# Patient Record
Sex: Female | Born: 1980 | Race: White | Hispanic: No | Marital: Single | Smoking: Never smoker
Health system: Southern US, Community
[De-identification: ages and names within clinical notes are randomized; demographics above are authoritative.]

## PROBLEM LIST (undated history)

## (undated) DIAGNOSIS — F419 Anxiety disorder, unspecified: Secondary | ICD-10-CM

## (undated) DIAGNOSIS — K760 Fatty (change of) liver, not elsewhere classified: Secondary | ICD-10-CM

## (undated) DIAGNOSIS — R238 Other skin changes: Secondary | ICD-10-CM

## (undated) DIAGNOSIS — H9190 Unspecified hearing loss, unspecified ear: Secondary | ICD-10-CM

## (undated) DIAGNOSIS — R112 Nausea with vomiting, unspecified: Secondary | ICD-10-CM

## (undated) DIAGNOSIS — E039 Hypothyroidism, unspecified: Secondary | ICD-10-CM

## (undated) DIAGNOSIS — Z9889 Other specified postprocedural states: Secondary | ICD-10-CM

## (undated) DIAGNOSIS — L853 Xerosis cutis: Secondary | ICD-10-CM

## (undated) DIAGNOSIS — K224 Dyskinesia of esophagus: Secondary | ICD-10-CM

## (undated) DIAGNOSIS — Q909 Down syndrome, unspecified: Secondary | ICD-10-CM

## (undated) DIAGNOSIS — K219 Gastro-esophageal reflux disease without esophagitis: Secondary | ICD-10-CM

## (undated) DIAGNOSIS — H6093 Unspecified otitis externa, bilateral: Secondary | ICD-10-CM

## (undated) DIAGNOSIS — C49A2 Gastrointestinal stromal tumor of stomach: Secondary | ICD-10-CM

## (undated) DIAGNOSIS — K8020 Calculus of gallbladder without cholecystitis without obstruction: Secondary | ICD-10-CM

## (undated) DIAGNOSIS — E785 Hyperlipidemia, unspecified: Secondary | ICD-10-CM

## (undated) HISTORY — DX: Calculus of gallbladder without cholecystitis without obstruction: K80.20

## (undated) HISTORY — DX: Unspecified otitis externa, bilateral: H60.93

## (undated) HISTORY — PX: NASAL ENDOSCOPY: SHX286

## (undated) HISTORY — PX: TONSILLECTOMY: SUR1361

## (undated) HISTORY — PX: WISDOM TOOTH EXTRACTION: SHX21

## (undated) HISTORY — DX: Xerosis cutis: L85.3

## (undated) HISTORY — DX: Down syndrome, unspecified: Q90.9

## (undated) HISTORY — DX: Gastro-esophageal reflux disease without esophagitis: K21.9

## (undated) HISTORY — DX: Dyskinesia of esophagus: K22.4

## (undated) HISTORY — DX: Fatty (change of) liver, not elsewhere classified: K76.0

## (undated) HISTORY — DX: Hyperlipidemia, unspecified: E78.5

## (undated) HISTORY — DX: Gastrointestinal stromal tumor of stomach: C49.A2

---

## 2002-10-03 ENCOUNTER — Ambulatory Visit (HOSPITAL_BASED_OUTPATIENT_CLINIC_OR_DEPARTMENT_OTHER): Admission: RE | Admit: 2002-10-03 | Discharge: 2002-10-03 | Payer: Self-pay | Admitting: General Surgery

## 2004-01-27 ENCOUNTER — Ambulatory Visit: Payer: Self-pay | Admitting: Endocrinology

## 2004-02-04 ENCOUNTER — Ambulatory Visit: Payer: Self-pay | Admitting: Endocrinology

## 2004-04-13 ENCOUNTER — Ambulatory Visit: Payer: Self-pay | Admitting: Endocrinology

## 2004-06-25 ENCOUNTER — Ambulatory Visit: Payer: Self-pay | Admitting: Endocrinology

## 2004-11-29 ENCOUNTER — Ambulatory Visit: Payer: Self-pay | Admitting: Endocrinology

## 2006-01-11 ENCOUNTER — Ambulatory Visit: Payer: Self-pay | Admitting: Endocrinology

## 2007-03-20 ENCOUNTER — Encounter: Payer: Self-pay | Admitting: Endocrinology

## 2007-03-20 ENCOUNTER — Ambulatory Visit: Payer: Self-pay | Admitting: Endocrinology

## 2007-03-20 DIAGNOSIS — E7800 Pure hypercholesterolemia, unspecified: Secondary | ICD-10-CM | POA: Insufficient documentation

## 2007-03-20 DIAGNOSIS — E039 Hypothyroidism, unspecified: Secondary | ICD-10-CM | POA: Insufficient documentation

## 2007-03-20 DIAGNOSIS — Q909 Down syndrome, unspecified: Secondary | ICD-10-CM | POA: Insufficient documentation

## 2007-03-20 DIAGNOSIS — R7309 Other abnormal glucose: Secondary | ICD-10-CM

## 2007-03-20 LAB — CONVERTED CEMR LAB
Hgb A1c MFr Bld: 4.9 % (ref 4.6–6.0)
TSH: 22.47 microintl units/mL — ABNORMAL HIGH (ref 0.35–5.50)
Triglycerides: 133 mg/dL (ref 0–149)
VLDL: 27 mg/dL (ref 0–40)

## 2007-05-29 ENCOUNTER — Ambulatory Visit: Payer: Self-pay | Admitting: Endocrinology

## 2007-05-29 LAB — CONVERTED CEMR LAB
Cholesterol: 141 mg/dL (ref 0–200)
HDL: 30.4 mg/dL — ABNORMAL LOW (ref >39.0)
Total CHOL/HDL Ratio: 4.6
Triglycerides: 108 mg/dL (ref 0–149)

## 2007-06-19 ENCOUNTER — Telehealth (INDEPENDENT_AMBULATORY_CARE_PROVIDER_SITE_OTHER): Payer: Self-pay | Admitting: *Deleted

## 2008-03-20 ENCOUNTER — Ambulatory Visit: Payer: Self-pay | Admitting: Endocrinology

## 2008-03-20 LAB — CONVERTED CEMR LAB
AST: 19 units/L (ref 0–37)
Albumin: 3.8 g/dL (ref 3.5–5.2)
Calcium, Total (PTH): 9.2 mg/dL (ref 8.4–10.5)
HDL: 36.2 mg/dL — ABNORMAL LOW (ref >39.0)
Hgb A1c MFr Bld: 5 % (ref 4.6–6.0)
LDL Cholesterol: 94 mg/dL (ref 0–99)
TSH: 0.26 microintl units/mL — ABNORMAL LOW (ref 0.35–5.50)
Total Bilirubin: 1.1 mg/dL (ref 0.3–1.2)
Total CHOL/HDL Ratio: 4.4
Triglycerides: 147 mg/dL (ref 0–149)
VLDL: 29 mg/dL (ref 0–40)

## 2009-04-16 ENCOUNTER — Ambulatory Visit: Payer: Self-pay | Admitting: Endocrinology

## 2009-04-16 LAB — CONVERTED CEMR LAB
ALT: 17 units/L (ref 0–35)
Alkaline Phosphatase: 72 units/L (ref 39–117)
Bilirubin, Direct: 0.1 mg/dL (ref 0.0–0.3)
Cholesterol: 132 mg/dL (ref 0–200)
Hgb A1c MFr Bld: 4.9 % (ref 4.6–6.5)
Total Bilirubin: 0.9 mg/dL (ref 0.3–1.2)
VLDL: 30.8 mg/dL (ref 0.0–40.0)

## 2009-04-21 ENCOUNTER — Ambulatory Visit: Payer: Self-pay | Admitting: Family Medicine

## 2009-04-21 ENCOUNTER — Encounter: Payer: Self-pay | Admitting: Endocrinology

## 2010-02-09 NOTE — Miscellaneous (Signed)
Summary: BONE DENSITY  Clinical Lists Changes  Orders: Added new Test order of T-Bone Densitometry (77080) - Signed Added new Test order of T-Lumbar Vertebral Assessment (77082) - Signed 

## 2010-02-09 NOTE — Assessment & Plan Note (Signed)
Summary: FU Sara Mcgee   Vital Signs:  Patient profile:   30 year old female Height:      60 inches (152.40 cm) Weight:      169 pounds (76.82 kg) BMI:     33.12 O2 Sat:      97 % on Room air Temp:     97.7 degrees F (36.50 degrees C) oral Pulse rate:   82 / minute BP sitting:   108 / 76  (left arm) Cuff size:   regular  Vitals Entered By: Josph Macho RMA (April 16, 2009 9:58 AM)  O2 Flow:  Room air CC: Follow-up visit/ pts caretaker states she is not taking the Synthroid 112 MCG/ CF Is Patient Diabetic? No   Primary Provider:  d moore  CC:  Follow-up visit/ pts caretaker states she is not taking the Synthroid 112 MCG/ CF.  History of Present Illness: the status of at least 3 ongoing medical problems is addressed today: hypothyroidism:  pt is here with sister-in-law (Sara Mcgee).  pt says she feels well on synthroid. dyslipidemia:  she takes lovastatin as rx'ed.  mother says pt's diet is better, since she has been going to "curves." down's syndrome:  she has regular menses, despite no estrogen rx.  no falls.    Current Medications (verified): 1)  Synthroid 88 Mcg  Tabs (Levothyroxine Sodium) .... Take 1 By Mouth Once Daily Need Appt For Addtional Refills 2)  Lovastatin 40 Mg  Tabs (Lovastatin) .... Take 2 By Mouth Qd 3)  Synthroid 112 Mcg  Tabs (Levothyroxine Sodium) .... Qd  Allergies (verified): No Known Drug Allergies  Past History:  Past Medical History: Last updated: 03/20/2007 Hyperthyroidism Hypothyroidism  Review of Systems  The patient denies dyspnea on exertion and chest pain.    Physical Exam  General:  obese.  down's syndrome appearance. Neck:  no masses, thyromegaly, or abnormal cervical nodes Lungs:  Clear to auscultation bilaterally. Normal respiratory effort.  Heart:  Regular rate and rhythm without murmurs or gallops noted. Normal S1,S2.   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 1 once daily  Other Orders: T-Bone Densitometry (19147) TLB-Lipid Panel (80061-LIPID) TLB-Hepatic/Liver Function Pnl (80076-HEPATIC) TLB-TSH (Thyroid Stimulating Hormone) (84443-TSH) TLB-A1C / Hgb A1C (Glycohemoglobin) (83036-A1C) Est. Patient Level IV (82956)  Patient Instructions: 1)  tests are being ordered for you today.  a few  days after the test(s), please call [phone] to hear your test results. 2)  pending the test results, please continue the same medications for now. 3)  check bone-density x ray. 4)  Please schedule a follow-up appointment in 1 year. 5)  (update: i left message on phone-tree:  please verify synthroid dosage currently is 88/day.  then reduce to 50/day.  go to lab in 4-6 weeks for tsh 244.9). Prescriptions: LEVOTHYROXINE SODIUM 50 MCG TABS (LEVOTHYROXINE SODIUM) 1 once daily  #30 x 2   Entered and Authorized by:   Minus Breeding MD   Signed by:   Minus Breeding MD on 04/16/2009   Method used:   Electronically to        [facility] [phone]* (retail)       [address] [place], [place]  [address]       Ph: [phone] or [phone]       Fax: [phone]   RxID:   [id number]   Appended Document: Orders Update    Clinical Lists Changes  Orders: Added new Service order of Prescription Created Electronically  [phone]) - Signed

## 2010-05-28 NOTE — Op Note (Signed)
   NAME:  LYNDIA, BURY                           ACCOUNT NO.:  [id number]   MEDICAL RECORD NO.:  [id number]                   PATIENT TYPE:  AMB   LOCATION:  [facility]                                  FACILITY:  [facility]   PHYSICIAN:  Griffith Citron Mohorn, D.D.S.          DATE OF BIRTH:  [birth year]   DATE OF PROCEDURE:  10/03/2002  DATE OF DISCHARGE:                                 OPERATIVE REPORT   PREOPERATIVE DIAGNOSIS:  1. Decayed nonrestorable tooth number 20.  2. Down syndrome.   POSTOPERATIVE DIAGNOSIS:  1. Decayed nonrestorable tooth number 20.  2. Down syndrome.   OPERATION PERFORMED:  Extraction of tooth number 20.   SURGEON:  Griffith Citron Mohorn, D.D.S.   ANESTHESIA:  General.   INDICATIONS FOR PROCEDURE:  The patient was evaluated in my office and found  to have a painful and nonrestorable tooth number 20 due to deep decay.  The  patient initially seemed sufficiently cooperative despite her condition;  however, on the day of surgery in the office she was unable to cooperate  sufficiently to allow for placement of an IV in order to perform deep  sedation to have tooth number 20 taken out in the office.  The procedure was  terminated and she was scheduled for the same procedure at [facility].   DESCRIPTION OF PROCEDURE:  Once the anesthesia team was able to establish IV  access, the patient was brought to the room and placed in supine position.  Once general anesthesia was induced via orotracheal intubation, the patient  was prepped and draped for a maxillofacial procedure of this type.  Initially, local anesthesia was injected consisting of 2% lidocaine with  1:100,000 epinephrine adjacent to tooth #20.  An incision was made adjacent  to tooth #20 and the gingival tissue was reflected.  The tooth was loosened  and extracted without complications.  The apex of the socket was curetted  and irrigated with normal saline.  A single 4.0 chromic suture was  placed  over the extraction site to facilitate hemostasis and healing.  The patient  was allowed to awaken and was extubated without complications.  She was  transferred to the PACU in stable and satisfactory condition.                                                Griffith Citron Mohorn, D.D.S.    Gardenia Phlegm  D:  10/03/2002  T:  10/03/2002  Job:  [id number]

## 2012-02-16 ENCOUNTER — Other Ambulatory Visit (HOSPITAL_COMMUNITY): Payer: Self-pay | Admitting: Otolaryngology

## 2012-02-16 ENCOUNTER — Ambulatory Visit (HOSPITAL_COMMUNITY)
Admission: RE | Admit: 2012-02-16 | Discharge: 2012-02-16 | Disposition: A | Discharge status: Home / Self Care | Payer: Medicare Other | Source: Ambulatory Visit | Attending: Otolaryngology | Admitting: Otolaryngology

## 2012-02-16 DIAGNOSIS — M532X1 Spinal instabilities, occipito-atlanto-axial region: Secondary | ICD-10-CM

## 2012-02-16 DIAGNOSIS — IMO0002 Reserved for concepts with insufficient information to code with codable children: Secondary | ICD-10-CM | POA: Insufficient documentation

## 2012-02-16 DIAGNOSIS — Z01818 Encounter for other preprocedural examination: Secondary | ICD-10-CM | POA: Insufficient documentation

## 2012-04-10 ENCOUNTER — Other Ambulatory Visit: Payer: Self-pay | Admitting: Otolaryngology

## 2012-11-05 ENCOUNTER — Ambulatory Visit (INDEPENDENT_AMBULATORY_CARE_PROVIDER_SITE_OTHER): Payer: Medicare Other | Admitting: Nurse Practitioner

## 2012-11-05 ENCOUNTER — Encounter: Payer: Self-pay | Admitting: Nurse Practitioner

## 2012-11-05 VITALS — BP 122/87 | HR 80 | Temp 98.0°F | Ht 60.0 in | Wt 175.0 lb

## 2012-11-05 DIAGNOSIS — Z0000 Encounter for general adult medical examination without abnormal findings: Secondary | ICD-10-CM

## 2012-11-05 DIAGNOSIS — E7800 Pure hypercholesterolemia, unspecified: Secondary | ICD-10-CM

## 2012-11-05 DIAGNOSIS — R1310 Dysphagia, unspecified: Secondary | ICD-10-CM

## 2012-11-05 DIAGNOSIS — Q909 Down syndrome, unspecified: Secondary | ICD-10-CM

## 2012-11-05 DIAGNOSIS — E039 Hypothyroidism, unspecified: Secondary | ICD-10-CM

## 2012-11-05 DIAGNOSIS — R7309 Other abnormal glucose: Secondary | ICD-10-CM

## 2012-11-05 DIAGNOSIS — Z23 Encounter for immunization: Secondary | ICD-10-CM

## 2012-11-05 NOTE — Patient Instructions (Addendum)

## 2012-11-05 NOTE — Progress Notes (Signed)
Subjective:    Patient ID: Sara Mcgee, female    DOB: 1980-04-22, 32 y.o.   MRN: [id number]  HPI Patient brpught in by sister in law or CPE- she is doing quite well- no changes since last visit. Patient Active Problem List   Diagnosis Date Noted  . HYPOTHYROIDISM 03/20/2007  . HYPERCHOLESTEROLEMIA 03/20/2007  . DOWN SYNDROME 03/20/2007  . HYPERGLYCEMIA 03/20/2007   Outpatient Encounter Prescriptions as of 11/05/2012  Medication Sig Dispense Refill  . ammonium lactate (AMLACTIN) 12 % cream One application daily to feet at bedtime      . ketoconazole (NIZORAL) 2 % cream Apply to area bid prn      . levothyroxine (SYNTHROID, LEVOTHROID) 100 MCG tablet Take 1 tablet by mouth daily.      . metroNIDAZOLE (METROCREAM) 0.75 % cream One application daily       No facility-administered encounter medications on file as of 11/05/2012.   * Can't do PAP so has U/S - hasn't had one in 4 years * Trouble swallowing- gets choked on her food- her brother had to have esophagus stretched    Review of Systems  Constitutional: Negative.   HENT: Negative.   Eyes: Negative.   Respiratory: Negative.   Cardiovascular: Negative.   Gastrointestinal: Negative.   Genitourinary: Negative.   Musculoskeletal: Negative.   Neurological: Negative.   Psychiatric/Behavioral: Negative.        Objective:   Physical Exam  Constitutional: She is oriented to person, place, and time. She appears well-developed and well-nourished.  HENT:  Nose: Nose normal.  Mouth/Throat: Oropharynx is clear and moist.  Eyes: EOM are normal.  Neck: Trachea normal, normal range of motion and full passive range of motion without pain. Neck supple. No JVD present. Carotid bruit is not present. No thyromegaly present.  Cardiovascular: Normal rate, regular rhythm, normal heart sounds and intact distal pulses.  Exam reveals no gallop and no friction rub.   No murmur heard. Pulmonary/Chest: Effort normal and breath sounds normal.   Abdominal: Soft. Bowel sounds are normal. She exhibits no distension and no mass. There is no tenderness.  Musculoskeletal: Normal range of motion.  Lymphadenopathy:    She has no cervical adenopathy.  Neurological: She is alert and oriented to person, place, and time. She has normal reflexes.  Skin: Skin is warm and dry.  Psychiatric: She has a normal mood and affect. Her behavior is normal. Judgment and thought content normal.    BP 122/87  Pulse 80  Temp(Src) 98 F (36.7 C) (Oral)  Ht 5' (1.524 m)  Wt 175 lb (79.379 kg)  BMI 34.18 kg/m2  LMP 10/06/2012       Assessment & Plan:   1. HYPOTHYROIDISM   2. DOWN SYNDROME   3. HYPERCHOLESTEROLEMIA   4. HYPERGLYCEMIA   5. Annual physical exam   6. Dysphagia    Orders Placed This Encounter  Procedures  . CMP14+EGFR  . NMR, lipoprofile  . Thyroid Panel With TSH  . Ambulatory referral to Gastroenterology    Referral Priority:  Routine    Referral Type:  Consultation    Referral Reason:  Specialty Services Required    Referred to Provider:  Mardella Layman, MD    Requested Specialty:  Gastroenterology    Number of Visits Requested:  1  . Ambulatory referral to Gynecology    Referral Priority:  Routine    Referral Type:  Consultation    Referral Reason:  Specialty Services Required    Requested Specialty:  Gynecology    Number of Visits Requested:  1   Meds ordered this encounter  Medications  . levothyroxine (SYNTHROID, LEVOTHROID) 100 MCG tablet    Sig: Take 1 tablet by mouth daily.  Marland Kitchen ketoconazole (NIZORAL) 2 % cream    Sig: Apply to area bid prn  . metroNIDAZOLE (METROCREAM) 0.75 % cream    Sig: One application daily  . ammonium lactate (AMLACTIN) 12 % cream    Sig: One application daily to feet at bedtime    Continue all meds Labs pending Diet and exercise encouraged Health maintenance reviewed Follow up in 6 months  Mary-Margaret Daphine Deutscher, FNP

## 2012-11-07 ENCOUNTER — Other Ambulatory Visit: Payer: Self-pay | Admitting: Nurse Practitioner

## 2012-11-07 LAB — CMP14+EGFR
ALT: 18 IU/L (ref 0–32)
AST: 17 IU/L (ref 0–40)
Albumin/Globulin Ratio: 1.4 (ref 1.1–2.5)
Alkaline Phosphatase: 80 IU/L (ref 39–117)
CO2: 27 mmol/L (ref 18–29)
Calcium: 9.5 mg/dL (ref 8.7–10.2)
Creatinine, Ser: 0.77 mg/dL (ref 0.57–1.00)
GFR calc Af Amer: 119 mL/min/{1.73_m2} (ref >59)
Globulin, Total: 2.9 g/dL (ref 1.5–4.5)
Potassium: 5 mmol/L (ref 3.5–5.2)
Sodium: 144 mmol/L (ref 134–144)
Total Bilirubin: 0.4 mg/dL (ref 0.0–1.2)

## 2012-11-07 LAB — THYROID PANEL WITH TSH
T4, Total: 10.3 ug/dL (ref 4.5–12.0)
TSH: 0.518 u[IU]/mL (ref 0.450–4.500)

## 2012-11-07 LAB — NMR, LIPOPROFILE
Cholesterol: 191 mg/dL (ref <200)
HDL Cholesterol by NMR: 45 mg/dL (ref >=40)
LDL Size: 21 nm (ref >20.5)
LDLC SERPL CALC-MCNC: 119 mg/dL — ABNORMAL HIGH (ref <100)
LP-IR Score: 43 (ref <=45)
Small LDL Particle Number: 435 nmol/L (ref <=527)

## 2012-11-07 MED ORDER — LEVOTHYROXINE SODIUM 88 MCG PO TABS: 88.0000 ug | ORAL_TABLET | Freq: Every day | ORAL | Status: DC

## 2012-11-10 ENCOUNTER — Encounter: Payer: Self-pay | Admitting: Nurse Practitioner

## 2012-11-14 ENCOUNTER — Telehealth: Payer: Self-pay | Admitting: Nurse Practitioner

## 2012-11-15 ENCOUNTER — Encounter: Payer: Self-pay | Admitting: Gastroenterology

## 2012-12-12 ENCOUNTER — Ambulatory Visit: Payer: Self-pay | Admitting: Gastroenterology

## 2013-03-04 ENCOUNTER — Telehealth: Payer: Self-pay | Admitting: Family Medicine

## 2013-03-13 ENCOUNTER — Other Ambulatory Visit: Payer: Self-pay

## 2013-03-13 MED ORDER — AMMONIUM LACTATE 12 % EX CREA: TOPICAL_CREAM | CUTANEOUS | Status: DC | PRN

## 2013-04-24 ENCOUNTER — Other Ambulatory Visit (INDEPENDENT_AMBULATORY_CARE_PROVIDER_SITE_OTHER): Payer: Medicare Other

## 2013-04-24 DIAGNOSIS — E039 Hypothyroidism, unspecified: Secondary | ICD-10-CM

## 2013-04-24 NOTE — Progress Notes (Signed)
Patient came in for labs only.

## 2013-04-25 LAB — THYROID PANEL WITH TSH
FREE THYROXINE INDEX: 2.5 (ref 1.2–4.9)
T3 UPTAKE RATIO: 28 % (ref 24–39)
T4 TOTAL: 9 ug/dL (ref 4.5–12.0)
TSH: 0.929 u[IU]/mL (ref 0.450–4.500)

## 2013-04-29 ENCOUNTER — Telehealth: Payer: Self-pay | Admitting: Nurse Practitioner

## 2013-05-01 NOTE — Telephone Encounter (Signed)
Patient aware.

## 2013-08-05 ENCOUNTER — Telehealth: Payer: Self-pay | Admitting: Nurse Practitioner

## 2013-08-05 NOTE — Telephone Encounter (Signed)
Appt given per caregivers request

## 2013-08-08 ENCOUNTER — Ambulatory Visit (INDEPENDENT_AMBULATORY_CARE_PROVIDER_SITE_OTHER): Payer: Medicare Other | Admitting: Nurse Practitioner

## 2013-08-08 ENCOUNTER — Encounter: Payer: Self-pay | Admitting: Nurse Practitioner

## 2013-08-08 VITALS — BP 137/80 | HR 85 | Temp 97.6°F | Ht <= 58 in | Wt 179.0 lb

## 2013-08-08 DIAGNOSIS — B372 Candidiasis of skin and nail: Secondary | ICD-10-CM

## 2013-08-08 DIAGNOSIS — R234 Changes in skin texture: Secondary | ICD-10-CM

## 2013-08-08 DIAGNOSIS — L988 Other specified disorders of the skin and subcutaneous tissue: Secondary | ICD-10-CM

## 2013-08-08 DIAGNOSIS — R000 Tachycardia, unspecified: Secondary | ICD-10-CM

## 2013-08-08 DIAGNOSIS — Z01818 Encounter for other preprocedural examination: Secondary | ICD-10-CM

## 2013-08-08 MED ORDER — NYSTATIN 100000 UNIT/GM EX CREA: 1.0000 "application " | TOPICAL_CREAM | Freq: Two times a day (BID) | CUTANEOUS | Status: DC

## 2013-08-08 NOTE — Progress Notes (Signed)
   Subjective:    Patient ID: Sara Mcgee, female    DOB: 22-Mar-1980, 33 y.o.   MRN: [id number]  HPI Patient here for surgical clearance for dental procedure- SHe has Down's syndrome and we need to make sure her heart is okay before she is put to sleep. * She has a bad problem with dry cracking areas on feet- They have cream they put on it and put her socks on at night. Has a deep fissure on ball of right foot that will not heal. * also has a rash in the left groin area.  Patient Active Problem List   Diagnosis Date Noted  . HYPOTHYROIDISM 03/20/2007  . HYPERCHOLESTEROLEMIA 03/20/2007  . DOWN SYNDROME 03/20/2007  . HYPERGLYCEMIA 03/20/2007   Outpatient Encounter Prescriptions as of 08/08/2013  Medication Sig  . ammonium lactate (AMLACTIN) 12 % cream Apply topically as needed for dry skin. One application daily to feet at bedtime  . ketoconazole (NIZORAL) 2 % cream Apply to area bid prn  . levothyroxine (SYNTHROID, LEVOTHROID) 88 MCG tablet Take 1 tablet (88 mcg total) by mouth daily.  . metroNIDAZOLE (METROCREAM) 1.93 % cream One application daily     Review of Systems  Constitutional: Negative.   HENT: Negative.   Respiratory: Negative.   Cardiovascular: Negative.   Genitourinary: Negative.   Neurological: Negative.   Psychiatric/Behavioral: Negative.   All other systems reviewed and are negative.      Objective:   Physical Exam  Constitutional: She appears well-developed and well-nourished.  Cardiovascular: Normal rate, regular rhythm and normal heart sounds.   Pulmonary/Chest: Effort normal and breath sounds normal.  Neurological: She is alert.  Skin: Skin is warm.  Psychiatric: She has a normal mood and affect. Her behavior is normal. Judgment and thought content normal.   BP 137/80  Pulse 85  Temp(Src) 97.6 F (36.4 C) (Oral)  Ht 4' 7.5" (1.41 m)  Wt 179 lb (81.194 kg)  BMI 40.84 kg/m2  LMP 02/10/2013        Assessment & Plan:  1. Preoperative  clearance Cleared for surgery - EKG 12-Lead  2. Tachycardia Probably due to being anxious during procedure - Thyroid Panel With TSH  3. Skin fissure duoderm apply daily  4. Cutaneous candidiasis Keep area clean and dry Avoid scratching - nystatin cream (MYCOSTATIN); Apply 1 application topically 2 (two) times daily.  Dispense: 30 g; Refill: 2   Mary-Margaret Hassell Done, FNP

## 2013-08-08 NOTE — Patient Instructions (Signed)
Cutaneous Candidiasis Cutaneous candidiasis is a condition in which there is an overgrowth of yeast (candida) on the skin. Yeast normally live on the skin, but in small enough numbers not to cause any symptoms. In certain cases, increased growth of the yeast may cause an actual yeast infection. This kind of infection usually occurs in areas of the skin that are constantly warm and moist, such as the armpits or the groin. Yeast is the most common cause of diaper rash in babies and in people who cannot control their bowel movements (incontinence). CAUSES  The fungus that most often causes cutaneous candidiasis is Candida albicans. Conditions that can increase the risk of getting a yeast infection of the skin include:  Obesity.  Pregnancy.  Diabetes.  Taking antibiotic medicine.  Taking birth control pills.  Taking steroid medicines.  Thyroid disease.  An iron or zinc deficiency.  Problems with the immune system. SYMPTOMS   Red, swollen area of the skin.  Bumps on the skin.  Itchiness. DIAGNOSIS  The diagnosis of cutaneous candidiasis is usually based on its appearance. Light scrapings of the skin may also be taken and viewed under a microscope to identify the presence of yeast. TREATMENT  Antifungal creams may be applied to the infected skin. In severe cases, oral medicines may be needed.  HOME CARE INSTRUCTIONS   Keep your skin clean and dry.  Maintain a healthy weight.  If you have diabetes, keep your blood sugar under control. SEEK IMMEDIATE MEDICAL CARE IF:  Your rash continues to spread despite treatment.  You have a fever, chills, or abdominal pain. Document Released: 09/14/2010 Document Revised: 03/21/2011 Document Reviewed: 09/14/2010 Exit[employer] Patient Information 2015 [employer]. This information is not intended to replace advice given to you by your health care provider. Make sure you discuss any questions you have with your health care provider.  

## 2013-08-09 DIAGNOSIS — K029 Dental caries, unspecified: Secondary | ICD-10-CM | POA: Insufficient documentation

## 2013-08-09 LAB — THYROID PANEL WITH TSH
Free Thyroxine Index: 2.2 (ref 1.2–4.9)
T3 Uptake Ratio: 27 % (ref 24–39)
T4 TOTAL: 8.1 ug/dL (ref 4.5–12.0)
TSH: 1.46 u[IU]/mL (ref 0.450–4.500)

## 2013-10-31 ENCOUNTER — Other Ambulatory Visit: Payer: Self-pay | Admitting: Nurse Practitioner

## 2013-11-02 ENCOUNTER — Other Ambulatory Visit: Payer: Self-pay | Admitting: Nurse Practitioner

## 2014-01-30 ENCOUNTER — Other Ambulatory Visit: Payer: Self-pay | Admitting: Nurse Practitioner

## 2014-03-05 ENCOUNTER — Ambulatory Visit (INDEPENDENT_AMBULATORY_CARE_PROVIDER_SITE_OTHER): Payer: Medicare Other | Admitting: Nurse Practitioner

## 2014-03-05 ENCOUNTER — Encounter: Payer: Self-pay | Admitting: Nurse Practitioner

## 2014-03-05 VITALS — BP 117/71 | HR 64 | Temp 97.4°F | Ht <= 58 in | Wt 183.0 lb

## 2014-03-05 DIAGNOSIS — I442 Atrioventricular block, complete: Secondary | ICD-10-CM

## 2014-03-05 DIAGNOSIS — Z01810 Encounter for preprocedural cardiovascular examination: Secondary | ICD-10-CM | POA: Diagnosis not present

## 2014-03-05 DIAGNOSIS — I519 Heart disease, unspecified: Secondary | ICD-10-CM | POA: Diagnosis not present

## 2014-03-05 DIAGNOSIS — E7800 Pure hypercholesterolemia, unspecified: Secondary | ICD-10-CM

## 2014-03-05 DIAGNOSIS — E039 Hypothyroidism, unspecified: Secondary | ICD-10-CM

## 2014-03-05 DIAGNOSIS — Q909 Down syndrome, unspecified: Secondary | ICD-10-CM

## 2014-03-05 DIAGNOSIS — I9789 Other postprocedural complications and disorders of the circulatory system, not elsewhere classified: Secondary | ICD-10-CM

## 2014-03-05 NOTE — Progress Notes (Signed)
   Subjective:    Patient ID: Sara Mcgee, female    DOB: [birth year], 34 y.o.   MRN: [id number]  HPI Patient is a 34 year old female who is here today accompanying by sister inlaw  for dental surgical clearance. She will be having surgery this Friday 03/07/14. Chronic disease were review no acute complain other than dental pain.   Patient Active Problem List   Diagnosis Date Noted  . Caries 08/09/2013  . Hypothyroidism 03/20/2007  . HYPERCHOLESTEROLEMIA 03/20/2007  . DOWN SYNDROME 03/20/2007   Current Outpatient Prescriptions on File Prior to Visit  Medication Sig Dispense Refill  . ammonium lactate (AMLACTIN) 12 % cream APPLY TOPICALLY AS NEEDED FOR DRY SKIN. ONE APPLICATION DAILY TO FEET AT  BEDTIME 385 g 0  . ketoconazole (NIZORAL) 2 % cream Apply to area bid prn    . levothyroxine (SYNTHROID, LEVOTHROID) 88 MCG tablet TAKE ONE TABLET BY MOUTH ONE TIME DAILY 90 tablet 2  . metroNIDAZOLE (METROCREAM) 2.35 % cream One application daily    . nystatin cream (MYCOSTATIN) Apply 1 application topically 2 (two) times daily. 30 g 2   No current facility-administered medications on file prior to visit.     Review of Systems  Constitutional: Negative.   HENT: Negative.   Eyes: Negative.   Respiratory: Negative.   Cardiovascular: Negative.   Gastrointestinal: Negative.   Endocrine: Negative.   Genitourinary: Negative.   Musculoskeletal: Negative.   Skin: Negative.        Right facial swelling and edema.   Allergic/Immunologic: Negative.   Neurological: Negative.   Hematological: Negative.   Psychiatric/Behavioral: Negative.        Objective:   Physical Exam  Constitutional: She is oriented to person, place, and time. She appears well-developed and well-nourished.  HENT:  Head: Normocephalic.  Eyes: Pupils are equal, round, and reactive to light.  Neck: Normal range of motion.  Cardiovascular: Normal rate.   Pulmonary/Chest: Effort normal.  Abdominal: Soft.  Neurological:  She is alert and oriented to person, place, and time.  Skin: Skin is warm. There is erythema (right side of the face. ).  Psychiatric: She has a normal mood and affect.    BP 117/71 mmHg  Pulse 64  Temp(Src) 97.4 F (36.3 C) (Oral)  Ht $R'4\' 7"'EV$  (1.397 m)  Wt 183 lb (83.008 kg)  BMI 42.53 kg/m2  EKG: normal EKG, normal sinus rhythm, previous tracing from 07/2013 was sinus tachycardia.-Mary-Margaret Hassell Done, FNP        Assessment & Plan:   1. HYPERCHOLESTEROLEMIA   2. DOWN SYNDROME   3. Hypothyroidism, unspecified hypothyroidism type   4. Surgical complete heart block   5. Preoperative cardiovascular examination    Orders Placed This Encounter  Procedures  . CMP14+EGFR  . NMR, lipoprofile  . Thyroid Panel With TSH  . EKG 12-Lead   Continue current meds Labs pending Follow up in 6 months  Mary-Margaret Hassell Done, FNP

## 2014-03-06 LAB — CMP14+EGFR
A/G RATIO: 1.5 (ref 1.1–2.5)
ALT: 23 IU/L (ref 0–32)
AST: 17 IU/L (ref 0–40)
Albumin: 4.4 g/dL (ref 3.5–5.5)
Alkaline Phosphatase: 75 IU/L (ref 39–117)
BUN/Creatinine Ratio: 10 (ref 8–20)
BUN: 10 mg/dL (ref 6–20)
Bilirubin Total: 0.6 mg/dL (ref 0.0–1.2)
CO2: 27 mmol/L (ref 18–29)
Calcium: 9.2 mg/dL (ref 8.7–10.2)
Chloride: 104 mmol/L (ref 97–108)
Creatinine, Ser: 1 mg/dL (ref 0.57–1.00)
GFR calc Af Amer: 86 mL/min/{1.73_m2} (ref >59)
GFR calc non Af Amer: 74 mL/min/{1.73_m2} (ref >59)
GLOBULIN, TOTAL: 2.9 g/dL (ref 1.5–4.5)
GLUCOSE: 88 mg/dL (ref 65–99)
POTASSIUM: 4.8 mmol/L (ref 3.5–5.2)
SODIUM: 145 mmol/L — AB (ref 134–144)
Total Protein: 7.3 g/dL (ref 6.0–8.5)

## 2014-03-06 LAB — THYROID PANEL WITH TSH
Free Thyroxine Index: 2.9 (ref 1.2–4.9)
T3 Uptake Ratio: 30 % (ref 24–39)
T4, Total: 9.6 ug/dL (ref 4.5–12.0)
TSH: 2.12 u[IU]/mL (ref 0.450–4.500)

## 2014-03-06 LAB — NMR, LIPOPROFILE
Cholesterol: 191 mg/dL (ref 100–199)
HDL Cholesterol by NMR: 42 mg/dL (ref >39)
HDL Particle Number: 22.8 umol/L — ABNORMAL LOW (ref >=30.5)
LDL PARTICLE NUMBER: 1376 nmol/L — AB (ref <1000)
LDL SIZE: 21.5 nm (ref >20.5)
LDL-C: 130 mg/dL — AB (ref 0–99)
LP-IR SCORE: 44 (ref <=45)
Small LDL Particle Number: 365 nmol/L (ref <=527)
Triglycerides by NMR: 96 mg/dL (ref 0–149)

## 2014-03-17 ENCOUNTER — Telehealth: Payer: Self-pay

## 2014-03-17 DIAGNOSIS — R1314 Dysphagia, pharyngoesophageal phase: Secondary | ICD-10-CM

## 2014-03-17 NOTE — Telephone Encounter (Signed)
Wants a referral for Dr Earlean Shawl

## 2014-03-18 NOTE — Telephone Encounter (Signed)
Referral made 

## 2014-03-18 NOTE — Telephone Encounter (Signed)
He is a GI dr. She is getting choked a ot

## 2014-03-18 NOTE — Telephone Encounter (Signed)
Who is that and what does she need referral for?

## 2014-04-02 ENCOUNTER — Encounter: Payer: Self-pay | Admitting: Internal Medicine

## 2014-05-28 ENCOUNTER — Ambulatory Visit (INDEPENDENT_AMBULATORY_CARE_PROVIDER_SITE_OTHER): Payer: Medicare Other | Admitting: Internal Medicine

## 2014-05-28 ENCOUNTER — Other Ambulatory Visit (INDEPENDENT_AMBULATORY_CARE_PROVIDER_SITE_OTHER): Payer: Medicare Other

## 2014-05-28 ENCOUNTER — Encounter: Payer: Self-pay | Admitting: Internal Medicine

## 2014-05-28 VITALS — BP 108/60 | HR 76 | Ht <= 58 in | Wt 182.0 lb

## 2014-05-28 DIAGNOSIS — R0989 Other specified symptoms and signs involving the circulatory and respiratory systems: Secondary | ICD-10-CM

## 2014-05-28 DIAGNOSIS — K529 Noninfective gastroenteritis and colitis, unspecified: Secondary | ICD-10-CM

## 2014-05-28 DIAGNOSIS — K8020 Calculus of gallbladder without cholecystitis without obstruction: Secondary | ICD-10-CM

## 2014-05-28 DIAGNOSIS — R197 Diarrhea, unspecified: Secondary | ICD-10-CM

## 2014-05-28 DIAGNOSIS — R1013 Epigastric pain: Secondary | ICD-10-CM

## 2014-05-28 DIAGNOSIS — F458 Other somatoform disorders: Secondary | ICD-10-CM

## 2014-05-28 DIAGNOSIS — R1310 Dysphagia, unspecified: Secondary | ICD-10-CM

## 2014-05-28 LAB — IGA: IGA: 466 mg/dL — AB (ref 68–378)

## 2014-05-28 MED ORDER — PANTOPRAZOLE SODIUM 40 MG PO TBEC: 40.0000 mg | DELAYED_RELEASE_TABLET | Freq: Every day | ORAL | Status: DC

## 2014-05-28 MED ORDER — PANTOPRAZOLE SODIUM 40 MG PO TBEC: 40.0000 mg | DELAYED_RELEASE_TABLET | Freq: Two times a day (BID) | ORAL | Status: DC

## 2014-05-28 NOTE — Progress Notes (Signed)
Patient ID: Sara Mcgee, female   DOB: 1980/09/26, 34 y.o.   MRN: [id number] HPI: Sara Mcgee is a 34 yo female with PMH of Down's syndrome, thyroid disease, dyslipidemia and gallstones who seen in consultation at the request of Chevis Pretty, NP to evaluate dysphagia and globus sensation also diarrhea.  She is here today with her sister-in-law. History is obtained both from the patient and from her sister-in-law. Over the past year she has been reporting "frog in her throat" which is present nearly all the time. She's also had issues with swallowing and food sticking after swallowing. This is particularly a problem with solid food such as steak, chicken and bread. Her family notes that occasionally after swallowing she stops eating, her eyes will start watering and it sounds like she is "talking through a bubble". Reportedly there is no coughing or trouble breathing. Occasionally food will come up when she tries to drink liquids during these episodes. Denies odynophagia. Appetite has been good. Symptoms worse with over eating. Family reports she chews her food well, take small bites and generally eats slowly. Her weight has been stable. She had one episode in the last month of epigastric pain. This lasted several hours. Was associated with decreased appetite, nausea and diaphoresis. Sister-in-law thought about taking her to the ER but eventually this resolved. Reportedly ultrasound about one year ago done at [facility] gallstones. Long-standing chronic diarrhea occurring 4-5 times per day. No nocturnal symptoms. Some accidents. Positive fecal urgency. Reportedly stool varies in color but denies melena or rectal bleeding. Patient did have tonsillectomy done about a year ago but this hasn't helped with her globus sensation.  Patient lives in stays with her sister-in-law during the week. Both of her parents are elderly and in their 71s. Diet is more controlled when staying with her sister-in-law, and she tends  to eat more sweets and fatty foods when staying at home with her parents.  Past Medical History  Diagnosis Date  . Down's syndrome   . Thyroid disease     hyperthyroid  . Bilateral external ear infections   . Dry skin     feet  . Dyslipidemia   . Gallstones     Past Surgical History  Procedure Laterality Date  . Tonsillectomy      Outpatient Prescriptions Prior to Visit  Medication Sig Dispense Refill  . ammonium lactate (AMLACTIN) 12 % cream APPLY TOPICALLY AS NEEDED FOR DRY SKIN. ONE APPLICATION DAILY TO FEET AT  BEDTIME 385 g 0  . ketoconazole (NIZORAL) 2 % cream Apply to area bid prn    . levothyroxine (SYNTHROID, LEVOTHROID) 88 MCG tablet TAKE ONE TABLET BY MOUTH ONE TIME DAILY 90 tablet 2  . metroNIDAZOLE (METROCREAM) 0.75 % cream Apply 1 application topically as needed. One application daily    . nystatin cream (MYCOSTATIN) Apply 1 application topically 2 (two) times daily. (Patient taking differently: Apply 1 application topically as needed. ) 30 g 2   No facility-administered medications prior to visit.    No Known Allergies  Family History  Problem Relation Age of Onset  . Hypertension Mother   . Diabetes Mother   . Hypertension Father   . Heart disease Father   . Colon cancer Neg Hx   . Colon polyps Neg Hx   . Esophageal cancer Neg Hx   . Kidney disease Mother     Stage 2    History  Substance Use Topics  . Smoking status: Never Smoker   .  Smokeless tobacco: Never Used  . Alcohol Use: No    ROS: As per history of present illness, otherwise negative  BP 108/60 mmHg  Pulse 76  Ht 4' 7.25" (1.403 m)  Wt 182 lb (82.555 kg)  BMI 41.94 kg/m2 Constitutional: Well-developed and well-nourished. No distress. Typical appearance for trisomy 21 HEENT:  Oropharynx is clear and moist. No oropharyngeal exudate. Conjunctivae are normal.  No scleral icterus. Neck: Neck supple. Trachea midline. Cardiovascular: Normal rate, regular rhythm and intact distal  pulses. No M/R/G Pulmonary/chest: Effort normal and breath sounds normal. No wheezing, rales or rhonchi. Abdominal: Soft, obese, nontender, nondistended. Bowel sounds active throughout.  Extremities: no clubbing, cyanosis, or edema Lymphadenopathy: No cervical adenopathy noted. Neurological: Alert and oriented to person place and time. Skin: Skin is warm and dry. No rashes noted. Psychiatric: Normal mood and affect. Behavior is normal.  RELEVANT LABS AND IMAGING:  CMP     Component Value Date/Time   NA 145* 03/05/2014 0850   K 4.8 03/05/2014 0850   CL 104 03/05/2014 0850   CO2 27 03/05/2014 0850   GLUCOSE 88 03/05/2014 0850   BUN 10 03/05/2014 0850   CREATININE 1.00 03/05/2014 0850   CALCIUM 9.2 03/05/2014 0850   CALCIUM 9.2 03/20/2008 2248   PROT 7.3 03/05/2014 0850   PROT 7.4 04/16/2009 1020   ALBUMIN 3.7 04/16/2009 1020   AST 17 03/05/2014 0850   ALT 23 03/05/2014 0850   ALKPHOS 75 03/05/2014 0850   BILITOT 0.6 03/05/2014 0850   BILITOT 0.4 11/05/2012 1124   GFRNONAA 74 03/05/2014 0850   GFRAA 86 03/05/2014 0850    ASSESSMENT/PLAN: 34 yo female with PMH of Down's syndrome, thyroid disease, dyslipidemia and gallstones who seen in consultation at the request of Chevis Pretty, NP to evaluate dysphagia and globus sensation also with chronic diarrhea with fecal urgency  1. Globus sensation and dysphagia -- differential includes GERD with esophagitis, esophageal stricture, eosinophilic esophagitis or motility disorder. Suspect she has reflux disease. We discussed evaluation with endoscopy but I would like to start first with barium swallow with tablet. Also empiric trial of pantoprazole 40 mg once daily, 30 minutes to one hour before breakfast. If symptoms improve with PPI and barium swallow unrevealing, may not need EGD. Family understands this plan and is happy with it.  2. Chronic diarrhea -- strong association between Down syndrome and celiac disease. Celiac panel  today. Stool studies to include C. Difficile (unlikely), parasites, Fecal leukocytes and fecal elastase. Pancreatic insufficiency also possible and if elastase is low then would benefit from Creon.  3. Gallstones -- episode of acute epigastric pain could've been a symptomatically cholelithiasis/biliary attack. If this becomes more frequently may need consideration of cholecystectomy. Request ultrasound from [facility] in [place].     TY:[id number], [address], [place] [address]

## 2014-05-28 NOTE — Patient Instructions (Addendum)
You have been scheduled for a Barium Esophogram at [facility] (1st floor of the hospital) on Tuesday 06/03/14 at 10:00 am. Please arrive 15 minutes prior to your appointment for registration. Make certain not to have anything to eat or drink 6 hours prior to your test. If you need to reschedule for any reason, please contact radiology at [phone] to do so. __________________________________________________________________ A barium swallow is an examination that concentrates on views of the esophagus. This tends to be a double contrast exam (barium and two liquids which, when combined, create a gas to distend the wall of the oesophagus) or single contrast (non-ionic iodine based). The study is usually tailored to your symptoms so a good history is essential. Attention is paid during the study to the form, structure and configuration of the esophagus, looking for functional disorders (such as aspiration, dysphagia, achalasia, motility and reflux) EXAMINATION You may be asked to change into a gown, depending on the type of swallow being performed. A radiologist and radiographer will perform the procedure. The radiologist will advise you of the type of contrast selected for your procedure and direct you during the exam. You will be asked to stand, sit or lie in several different positions and to hold a small amount of fluid in your mouth before being asked to swallow while the imaging is performed .In some instances you may be asked to swallow barium coated marshmallows to assess the motility of a solid food bolus. The exam can be recorded as a digital or video fluoroscopy procedure. POST PROCEDURE It will take 1-2 days for the barium to pass through your system. To facilitate this, it is important, unless otherwise directed, to increase your fluids for the next 24-48hrs and to resume your normal diet.  This test typically takes about 30 minutes to  perform. __________________________________________________________________________________  We have sent the following medications to your pharmacy for you to pick up at your convenience: Pantoprazole  40 mg daily  Your physician has requested that you go to the basement for the following lab work before leaving today: IgA, ttg, GI pathogen, leukocytes, fecal elactase

## 2014-05-29 ENCOUNTER — Telehealth: Payer: Self-pay | Admitting: *Deleted

## 2014-05-29 LAB — TISSUE TRANSGLUTAMINASE, IGA: Tissue Transglutaminase Ab, IgA: 1 U/mL (ref <4)

## 2014-05-29 NOTE — Telephone Encounter (Signed)
Patient's insurance will not cover pantoprazole as a formulary medication. Can we try omeprazole instead?

## 2014-05-29 NOTE — Telephone Encounter (Signed)
Yes, 40 mg daily

## 2014-05-30 MED ORDER — OMEPRAZOLE 20 MG PO CPDR: 40.0000 mg | DELAYED_RELEASE_CAPSULE | Freq: Every day | ORAL | Status: DC

## 2014-05-30 NOTE — Telephone Encounter (Signed)
Pantoprazole rx d/ced and omeprazole rx sent. Unfortunately, it appears even omeprazole 40 mg is not covered by insurance. It DOES appear they will cover omeprazole 20 mg tablets. Therefore, I have sent omeprazole 20 mg -Take 2 tablets by mouth once daily #60 2 refills. I have advised patient's POA, Jenine of this. She states that she did get 1 rx for pantoprazole this month because it was 10 dollars. However, she would prefer to get a cheaper medication if possible. I advised her of above and she verbalizes understanding.

## 2014-06-03 ENCOUNTER — Ambulatory Visit (HOSPITAL_COMMUNITY)
Admission: RE | Admit: 2014-06-03 | Discharge: 2014-06-03 | Disposition: A | Discharge status: Home / Self Care | Payer: Medicare Other | Source: Ambulatory Visit | Attending: Internal Medicine | Admitting: Internal Medicine

## 2014-06-03 DIAGNOSIS — K224 Dyskinesia of esophagus: Secondary | ICD-10-CM | POA: Insufficient documentation

## 2014-06-03 DIAGNOSIS — Q909 Down syndrome, unspecified: Secondary | ICD-10-CM | POA: Diagnosis not present

## 2014-06-03 DIAGNOSIS — R1310 Dysphagia, unspecified: Secondary | ICD-10-CM

## 2014-06-03 DIAGNOSIS — R0989 Other specified symptoms and signs involving the circulatory and respiratory systems: Secondary | ICD-10-CM | POA: Diagnosis present

## 2014-06-04 ENCOUNTER — Other Ambulatory Visit: Payer: Medicare Other

## 2014-06-04 NOTE — Progress Notes (Signed)
Didn't amb collect only printed order from requisition. Sent to [facility]  Dx codes K52.9

## 2014-06-11 ENCOUNTER — Telehealth: Payer: Self-pay | Admitting: Internal Medicine

## 2014-06-11 NOTE — Telephone Encounter (Signed)
Discussed with pts mother that we do not have results yet.

## 2014-07-02 ENCOUNTER — Telehealth: Payer: Self-pay | Admitting: Internal Medicine

## 2014-07-02 NOTE — Telephone Encounter (Signed)
Pts mother dropped off stool studies at [facility]. Tests still state future in the computer. Encouraged pts mother to call [facility] to see if they have the results or can call [facility] regarding the results. Pts mother verbalized understanding.

## 2014-07-03 ENCOUNTER — Telehealth: Payer: Self-pay | Admitting: Nurse Practitioner

## 2014-07-03 NOTE — Telephone Encounter (Signed)
lmom to call back 

## 2014-07-07 ENCOUNTER — Other Ambulatory Visit: Payer: Medicare Other

## 2014-07-07 DIAGNOSIS — R197 Diarrhea, unspecified: Secondary | ICD-10-CM

## 2014-07-07 NOTE — Progress Notes (Signed)
Lab only 

## 2014-07-11 LAB — CDIFF NAA+O+P+STOOL CULTURE
E coli, Shiga toxin Assay: NEGATIVE
Toxigenic C. Difficile by PCR: NEGATIVE

## 2014-07-11 LAB — FECAL LEUKOCYTES

## 2014-07-11 LAB — PANCREATIC ELASTASE, FECAL

## 2014-07-15 ENCOUNTER — Telehealth: Payer: Self-pay | Admitting: Internal Medicine

## 2014-07-15 DIAGNOSIS — R197 Diarrhea, unspecified: Secondary | ICD-10-CM

## 2014-07-15 NOTE — Telephone Encounter (Signed)
Stool studies negative for infection No white cells in stool to suggest inflammation Unfortunately not enough stool present for fecal elastase testing Would recommend repeating this if positive because if this suggests pancreatic insuff this would be easily treatable I assume chronic diarrhea is persisting? If so can try rifaximin 550 mg TID x 14 days for IBS-D

## 2014-07-15 NOTE — Telephone Encounter (Signed)
Left message for pt to call back.  Spoke with pts mother and she is aware. Paperwork faxed to [facility] for xifaxan, she is still having diarrhea. Order in epic.

## 2014-07-15 NOTE — Telephone Encounter (Signed)
Pts mother calling for stool tests that were done at the end of June. Specimen was turned in at [facility]. Please advise.

## 2014-07-17 ENCOUNTER — Encounter: Payer: Self-pay | Admitting: Internal Medicine

## 2014-07-25 ENCOUNTER — Other Ambulatory Visit: Payer: Self-pay

## 2014-07-25 ENCOUNTER — Other Ambulatory Visit: Payer: Medicare Other

## 2014-07-25 DIAGNOSIS — K529 Noninfective gastroenteritis and colitis, unspecified: Secondary | ICD-10-CM

## 2014-07-25 DIAGNOSIS — R197 Diarrhea, unspecified: Secondary | ICD-10-CM

## 2014-07-28 ENCOUNTER — Other Ambulatory Visit: Payer: Self-pay

## 2014-07-28 ENCOUNTER — Other Ambulatory Visit: Payer: Medicare Other

## 2014-07-28 ENCOUNTER — Other Ambulatory Visit: Payer: Self-pay | Admitting: Nurse Practitioner

## 2014-07-28 DIAGNOSIS — K529 Noninfective gastroenteritis and colitis, unspecified: Secondary | ICD-10-CM

## 2014-07-28 NOTE — Progress Notes (Signed)
Ordered by dr Ulice Dash pyrtle

## 2014-07-29 LAB — GASTROINTESTINAL PATHOGEN PANEL PCR

## 2014-07-29 LAB — OTHER SOLSTAS TEST

## 2014-07-30 ENCOUNTER — Other Ambulatory Visit: Payer: Medicare Other

## 2014-07-30 DIAGNOSIS — R197 Diarrhea, unspecified: Secondary | ICD-10-CM

## 2014-07-30 DIAGNOSIS — K529 Noninfective gastroenteritis and colitis, unspecified: Secondary | ICD-10-CM

## 2014-07-30 NOTE — Progress Notes (Signed)
Lab only 

## 2014-08-01 LAB — FECAL LEUKOCYTES

## 2014-08-01 LAB — PANCREATIC ELASTASE, FECAL: Pancreatic Elastase-1, Stool: >500 mcg/g

## 2014-08-05 LAB — FECAL LEUKOCYTES

## 2014-08-06 ENCOUNTER — Telehealth: Payer: Self-pay | Admitting: Internal Medicine

## 2014-08-06 NOTE — Telephone Encounter (Signed)
Left message for pt to call back  °

## 2014-08-06 NOTE — Telephone Encounter (Signed)
Noted  

## 2014-08-06 NOTE — Telephone Encounter (Signed)
Fecal elastase was normal which is not consistent with pancreatic insufficiency Is she still having loose stools?

## 2014-08-06 NOTE — Telephone Encounter (Signed)
Pts mother calling for lab results-fecal elastase for possible pancreatic insufficiency. Please advise.

## 2014-08-07 NOTE — Telephone Encounter (Signed)
Spoke with pts mother and she is aware of results. Pts mother states that she still has the loose stools at times, states it comes and goes.

## 2014-09-01 ENCOUNTER — Other Ambulatory Visit: Payer: Self-pay | Admitting: Internal Medicine

## 2014-10-02 ENCOUNTER — Telehealth: Payer: Self-pay | Admitting: Internal Medicine

## 2014-10-02 MED ORDER — LOPERAMIDE HCL 2 MG PO CAPS: 2.0000 mg | ORAL_CAPSULE | Freq: Every day | ORAL | Status: DC

## 2014-10-02 NOTE — Telephone Encounter (Signed)
Spoke with pts emergency contact and states pts heartburn seems to be controlled by the prilosec but she is still having bouts with diarrhea about an hour after she eats. States the xifaxan did not seem to make a difference. They are not giving the pt anything for diarrhea. Wanting to know what the next step is, please advise.

## 2014-10-02 NOTE — Telephone Encounter (Signed)
Chronic loose stools  No response to rifaximin Celiac neg Fecal WBC neg Fecal elastase normal Colonoscopy is often considered when further evaluating chronic loose stools That said, she can try adding loperamide 2 mg each morning, hold for constipation Call if no improvement Office followup next available

## 2014-10-02 NOTE — Telephone Encounter (Signed)
Spoke with POA and pt she is aware. Pt scheduled for OV with Dr. Hilarie Fredrickson 12/02/14@2 :45pm. POA aware of appt.

## 2014-11-10 ENCOUNTER — Encounter: Payer: Self-pay | Admitting: *Deleted

## 2014-12-02 ENCOUNTER — Ambulatory Visit (INDEPENDENT_AMBULATORY_CARE_PROVIDER_SITE_OTHER): Payer: Medicare Other | Admitting: Internal Medicine

## 2014-12-02 ENCOUNTER — Encounter: Payer: Self-pay | Admitting: Internal Medicine

## 2014-12-02 VITALS — BP 90/64 | HR 76 | Ht <= 58 in | Wt 186.4 lb

## 2014-12-02 DIAGNOSIS — K529 Noninfective gastroenteritis and colitis, unspecified: Secondary | ICD-10-CM

## 2014-12-02 DIAGNOSIS — K219 Gastro-esophageal reflux disease without esophagitis: Secondary | ICD-10-CM | POA: Diagnosis not present

## 2014-12-02 DIAGNOSIS — R0989 Other specified symptoms and signs involving the circulatory and respiratory systems: Secondary | ICD-10-CM

## 2014-12-02 DIAGNOSIS — F458 Other somatoform disorders: Secondary | ICD-10-CM | POA: Diagnosis not present

## 2014-12-02 MED ORDER — NA SULFATE-K SULFATE-MG SULF 17.5-3.13-1.6 GM/177ML PO SOLN: ORAL | Status: DC

## 2014-12-02 NOTE — Progress Notes (Signed)
Subjective:    Patient ID: Sara Mcgee, female    DOB: 1980/07/08, 34 y.o.   MRN: [id number]  HPI Sara Mcgee is a 34 year old female with Down syndrome, thyroid disease, dyslipidemia, and GERD who is seen for follow-up. She is here today with her sister-in-law. She was initially seen in May 2016 to evaluate globus sensation and dysphagia along with chronic diarrhea. After her visit she was started on daily PPI and barium swallow was performed. Swallow study showed mild esophageal dysmotility which was nonspecific and no evidence of ring or obstructing lesion. Barium tablet passed without delay. She has been taking omeprazole 40 mg daily. She still reports occasional "frog in my throat" but her sister-in-law feels strongly that her reflux overall is much better. Her parents however would like her off PPI medication because they're worried about the side effects. She lives with her parents on the weekend and her sister-in-law during the week. Her sister-in-law reports on the weekend her parents do not give her omeprazole but she does take it during the weeks when she is with her. From a diarrhea standpoint she had no improvement with rifaximin. Fecally last days was normal but her sister-in-law still feels that her stools are somewhat oily. Dominance occur 3-5 times per day worse after eating. She has taken Imodium 1 capsule in the morning and this is helped but not fully. Her parents again are worried about this medication and do not give it to her on the weekends. Reportedly no blood in her stool or melena.  After last visit stool studies were negative for infection, fecal leukocytes negative. Celiac panel negative  Review of Systems As per history of present illness, otherwise negative  Current Medications, Allergies, Past Medical History, Past Surgical History, Family History and Social History were reviewed in [employer] record.     Objective:   Physical Exam BP 90/64 mmHg   Pulse 76  Ht 4' 7.25" (1.403 m)  Wt 186 lb 6 oz (84.539 kg)  BMI 42.95 kg/m2  LMP 11/11/2014 Constitutional: Pleasant female in no acute distress, typical facial appearance for Down syndrome HEENT: Normocephalic and atraumatic. Oropharynx is clear and moist. No oropharyngeal exudate. Conjunctivae are normal.  No scleral icterus. Neck: Neck supple. Trachea midline. Cardiovascular: Normal rate, regular rhythm and intact distal pulses.  Pulmonary/chest: Effort normal and breath sounds normal. No wheezing, rales or rhonchi. Abdominal: Soft, obese, nontender, nondistended. Bowel sounds active throughout. Extremities: no clubbing, cyanosis, or edema Neurological: Alert and oriented to person place and time. Psychiatric: Normal mood and affect. Behavior is normal.  CLINICAL DATA:  Food getting stuck in throat for 1 year. Down syndrome.   EXAM: ESOPHOGRAM/BARIUM SWALLOW   TECHNIQUE: Single contrast examination was performed using  thin barium.   FLUOROSCOPY TIME:  Fluoroscopy Time:  4 minutes 11 seconds   Number of Acquired Images:  0   COMPARISON:  None.   FINDINGS: Mild degradation throughout secondary to patient inability to follow directions. Attempted evaluation of the hypopharynx is mildly degraded, without gross abnormality identified.   Evaluation of primary peristalsis demonstrates an incomplete primary peristaltic wave with contrast stasis throughout the thoracic esophagus.   Full column evaluation esophagus is somewhat limited by patient inability to perform successive swallows. This limitation primary degrades the evaluation of the lower esophagus. Given this mild limitation, no dominant esophageal stricture is seen.   A 13 mm barium tablet passes promptly.   IMPRESSION: 1. Mild degradation, as detailed above. 2. Moderate nonspecific esophageal  dysmotility. 3. No esophageal stricture.     Electronically Signed   By: Abigail Miyamoto M.D.   On: 06/03/2014  11:25      Assessment & Plan:  34 year old female with Down syndrome, thyroid disease, dyslipidemia, and GERD who is seen for follow-up  1. GERD with globus -- still with some globus sensation though overall symptoms are reportedly better with PPI. It appears PPI use is been somewhat inconsistent as described in history of present illness. We discussed possibly switching to ranitidine 150 mg twice a day given family concern for PPI. We discussed risk and benefits of PPI and given her response I would recommend continuing PPI. Her sister-in-law will further discuss this with the patient's parents. Given persistent globus with PPI therapy I recommended upper endoscopy to exclude reflux esophagitis and eosinophilic esophagitis. We discussed the risks, benefits and alternatives and she is agreeable to proceed. For now she will continue omeprazole 40 mg daily  2. Chronic diarrhea -- no response to rifaximin. Stool studies negative. Sister-in-law gives description for possible stearrhea though fecal elastase was normal. Could consider trial of Creon. I recommended colonoscopy to exclude microscopic colitis. We discussed the risks, benefits and alternatives and she is agreeable to proceed.

## 2014-12-02 NOTE — Patient Instructions (Signed)
You have been scheduled for an endoscopy and colonoscopy. Please follow the written instructions given to you at your visit today. Please pick up your prep supplies at the pharmacy within the next 1-3 days. If you use inhalers (even only as needed), please bring them with you on the day of your procedure. Your physician has requested that you go to www.startemmi.com and enter the access code given to you at your visit today. This web site gives a general overview about your procedure. However, you should still follow specific instructions given to you by our office regarding your preparation for the procedure.  Please continue omeprazole 40 mg daily.

## 2015-01-16 ENCOUNTER — Telehealth: Payer: Self-pay | Admitting: Internal Medicine

## 2015-01-16 NOTE — Telephone Encounter (Signed)
Ok with me 

## 2015-01-16 NOTE — Telephone Encounter (Signed)
Colon appt cancelled.

## 2015-01-16 NOTE — Telephone Encounter (Signed)
Pts sister-in-law states they want to cancel the colon and just have the egd done. States pt does not want to have colon and they are also afraid they could not keep her from eating. Ok to cancel colon and just leave EGD?

## 2015-01-20 ENCOUNTER — Other Ambulatory Visit: Payer: Self-pay | Admitting: Nurse Practitioner

## 2015-01-28 ENCOUNTER — Ambulatory Visit (AMBULATORY_SURGERY_CENTER): Payer: Medicare Other | Admitting: Internal Medicine

## 2015-01-28 ENCOUNTER — Other Ambulatory Visit: Payer: Self-pay

## 2015-01-28 ENCOUNTER — Other Ambulatory Visit (INDEPENDENT_AMBULATORY_CARE_PROVIDER_SITE_OTHER): Payer: Medicare Other

## 2015-01-28 ENCOUNTER — Telehealth: Payer: Self-pay

## 2015-01-28 ENCOUNTER — Encounter: Payer: Self-pay | Admitting: Internal Medicine

## 2015-01-28 VITALS — BP 120/76 | HR 96 | Temp 97.6°F | Resp 20 | Ht <= 58 in | Wt 186.0 lb

## 2015-01-28 DIAGNOSIS — I864 Gastric varices: Secondary | ICD-10-CM

## 2015-01-28 DIAGNOSIS — K219 Gastro-esophageal reflux disease without esophagitis: Secondary | ICD-10-CM | POA: Diagnosis not present

## 2015-01-28 DIAGNOSIS — F458 Other somatoform disorders: Secondary | ICD-10-CM

## 2015-01-28 DIAGNOSIS — K2950 Unspecified chronic gastritis without bleeding: Secondary | ICD-10-CM

## 2015-01-28 DIAGNOSIS — R1084 Generalized abdominal pain: Secondary | ICD-10-CM

## 2015-01-28 DIAGNOSIS — R0989 Other specified symptoms and signs involving the circulatory and respiratory systems: Secondary | ICD-10-CM

## 2015-01-28 LAB — CBC
HCT: 45.6 % (ref 36.0–46.0)
HEMOGLOBIN: 15.4 g/dL — AB (ref 12.0–15.0)
MCHC: 33.7 g/dL (ref 30.0–36.0)
MCV: 99.5 fl (ref 78.0–100.0)
Platelets: 272 10*3/uL (ref 150.0–400.0)
RBC: 4.58 Mil/uL (ref 3.87–5.11)
RDW: 13.7 % (ref 11.5–15.5)
WBC: 6.7 10*3/uL (ref 4.0–10.5)

## 2015-01-28 LAB — COMPREHENSIVE METABOLIC PANEL
ALK PHOS: 71 U/L (ref 39–117)
ALT: 28 U/L (ref 0–35)
AST: 22 U/L (ref 0–37)
Albumin: 3.8 g/dL (ref 3.5–5.2)
BILIRUBIN TOTAL: 0.8 mg/dL (ref 0.2–1.2)
BUN: 13 mg/dL (ref 6–23)
CO2: 31 meq/L (ref 19–32)
CREATININE: 1.01 mg/dL (ref 0.40–1.20)
Calcium: 8.9 mg/dL (ref 8.4–10.5)
Chloride: 106 mEq/L (ref 96–112)
GFR: 66.7 mL/min (ref >60.00)
GLUCOSE: 88 mg/dL (ref 70–99)
Potassium: 4.4 mEq/L (ref 3.5–5.1)
Sodium: 144 mEq/L (ref 135–145)
TOTAL PROTEIN: 7.3 g/dL (ref 6.0–8.3)

## 2015-01-28 LAB — PROTIME-INR
INR: 1.1 ratio — AB (ref 0.8–1.0)
Prothrombin Time: 11.9 s (ref 9.6–13.1)

## 2015-01-28 MED ORDER — OMEPRAZOLE 40 MG PO CPDR: 40.0000 mg | DELAYED_RELEASE_CAPSULE | Freq: Every day | ORAL | Status: DC

## 2015-01-28 MED ORDER — DIAZEPAM 5 MG PO TABS: ORAL_TABLET | ORAL | Status: DC

## 2015-01-28 MED ORDER — SODIUM CHLORIDE 0.9 % IV SOLN: 500.0000 mL | INTRAVENOUS | Status: DC

## 2015-01-28 NOTE — Patient Instructions (Addendum)
YOU HAD AN ENDOSCOPIC PROCEDURE TODAY AT [facility]:   Refer to the procedure report that was given to you for any specific questions about what was found during the examination.  If the procedure report does not answer your questions, please call your gastroenterologist to clarify.  If you requested that your care partner not be given the details of your procedure findings, then the procedure report has been included in a sealed envelope for you to review at your convenience later.  YOU SHOULD EXPECT: Some feelings of bloating in the abdomen. Passage of more gas than usual.  Walking can help get rid of the air that was put into your GI tract during the procedure and reduce the bloating. If you had a lower endoscopy (such as a colonoscopy or flexible sigmoidoscopy) you may notice spotting of blood in your stool or on the toilet paper. If you underwent a bowel prep for your procedure, you may not have a normal bowel movement for a few days.  Please Note:  You might notice some irritation and congestion in your nose or some drainage.  This is from the oxygen used during your procedure.  There is no need for concern and it should clear up in a day or so.  SYMPTOMS TO REPORT IMMEDIATELY:    Following upper endoscopy (EGD)  Vomiting of blood or coffee ground material  New chest pain or pain under the shoulder blades  Painful or persistently difficult swallowing  New shortness of breath  Fever of 100F or higher  Black, tarry-looking stools  For urgent or emergent issues, a gastroenterologist can be reached at any hour by calling [phone].   DIET: Your first meal following the procedure should be a small meal and then it is ok to progress to your normal diet. Heavy or fried foods are harder to digest and may make you feel nauseous or bloated.  Likewise, meals heavy in dairy and vegetables can increase bloating.  Drink plenty of fluids but you should avoid alcoholic beverages  for 24 hours.  ACTIVITY:  You should plan to take it easy for the rest of today and you should NOT DRIVE or use heavy machinery until tomorrow (because of the sedation medicines used during the test).    FOLLOW UP: Our staff will call the number listed on your records the next business day following your procedure to check on you and address any questions or concerns that you may have regarding the information given to you following your procedure. If we do not reach you, we will leave a message.  However, if you are feeling well and you are not experiencing any problems, there is no need to return our call.  We will assume that you have returned to your regular daily activities without incident.  If any biopsies were taken you will be contacted by phone or by letter within the next 1-3 weeks.  Please call us at [phone] if you have not heard about the biopsies in 3 weeks.    SIGNATURES/CONFIDENTIALITY: You and/or your care partner have signed paperwork which will be entered into your electronic medical record.  These signatures attest to the fact that that the information above on your After Visit Summary has been reviewed and is understood.  Full responsibility of the confidentiality of this discharge information lies with you and/or your care-partner.  CT scan scheduled. Instructions and contrast provided along with directions.  Accompanied to lab with transporter and care partner.

## 2015-01-28 NOTE — Telephone Encounter (Signed)
Pt scheduled for CT of A/P liver protocol for gastric varices/eval for cirrhosis 02/04/15@10am  at [facility]. Pt to be NPO after midnight drink bottle 1 of contrast at 8am, bottle 2 at [facility] RN to notify pt of appt.

## 2015-01-28 NOTE — Progress Notes (Signed)
Called to room to assist during endoscopic procedure.  Patient ID and intended procedure confirmed with present staff. Received instructions for my participation in the procedure from the performing physician.  

## 2015-01-28 NOTE — Op Note (Signed)
[place]  [employer]. [place], [address]   ENDOSCOPY PROCEDURE REPORT  PATIENT: Sara, Mcgee  MR#: [id number] BIRTHDATE: 1980/10/27 , 34  yrs. old GENDER: female ENDOSCOPIST: Jerene Bears, MD REFERRED BY:  Breck Coons, N.P. PROCEDURE DATE:  01/28/2015 PROCEDURE:  EGD, diagnostic and EGD w/ biopsy ASA CLASS:     Class III INDICATIONS:  history of GERD and globus sensation. MEDICATIONS: Monitored anesthesia care, Propofol 200 mg IV, and Versed 2 mg IV TOPICAL ANESTHETIC: none  DESCRIPTION OF PROCEDURE: After the risks benefits and alternatives of the procedure were thoroughly explained, informed consent was obtained.  The [id number] endoscope was introduced through the mouth and advanced to the second portion of the duodenum , Without limitations.  The instrument was slowly withdrawn as the mucosa was fully examined.   ESOPHAGUS: The mucosa of the esophagus appeared normal.  Biopsies were taken in the proximal and mid esophagus for eosinophilic esophagitis.   No esophageal varices seen.  STOMACH: Submucosal fullness was seen in the cardia most consistent with gastric varices.  Non-bleeding.   Moderate gastropathy was found in the cardia and gastric fundus with mosaic appearance most typical of portal hypertensive gastropathy.  There was more striped gastritis/gastropathy in the gastric body. Cold forcep biopsies were taken at the gastric body, antrum and angularis to evaluate for h.  pylori.  DUODENUM: The duodenal mucosa showed no abnormalities in the bulb and 2nd part of the duodenum.  Cold forcep biopsies were taken in the second portion.  Retroflexed views revealed as previously described.     The scope was then withdrawn from the patient and the procedure completed.  COMPLICATIONS: There were no immediate complications.  ENDOSCOPIC IMPRESSION: 1.   The mucosa of the esophagus appeared normal; multiple biopsies 2.   Fullness at  gastric cardia most consistent with gastric varices  3.   Gastropathy was found in the cardia, gastric fundus, and gastric body as described above; multiple biopsies 4.   The duodenal mucosa showed no abnormalities in the bulb and 2nd part of the duodenum cold forcep biopsies were taken in the second portion  RECOMMENDATIONS: 1.  Await pathology results 2.  Omeprazole 40 mg daily given inflammation seen today in the stomach 3.  CBC, CMP and INR today 4.  CT scan of the abdomen and pelvis to evaluate for changes of cirrhosis and portal hypertension 5.  eSigned:  Jerene Bears, MD 01/28/2015 3:23 PM    CC: the patient, Breck Coons  PATIENT NAME:  Sara, Mcgee MR#: [id number]

## 2015-01-28 NOTE — Progress Notes (Signed)
To recovery, report to Myers, RN, VSS. 

## 2015-01-29 ENCOUNTER — Telehealth: Payer: Self-pay | Admitting: Internal Medicine

## 2015-01-29 ENCOUNTER — Telehealth: Payer: Self-pay | Admitting: *Deleted

## 2015-01-29 NOTE — Telephone Encounter (Signed)
[place] that rx was already sent over yesterday for this. She states that she actually does see the rx.

## 2015-01-29 NOTE — Telephone Encounter (Signed)
  Follow up Call-  Call back number 01/28/2015  Post procedure Call Back phone  #  [phone]  Permission to leave phone message Yes     Patient questions:  Do you have a fever, pain , or abdominal swelling? No. Pain Score  0 *  Have you tolerated food without any problems? Yes.    Have you been able to return to your normal activities? Yes.    Do you have any questions about your discharge instructions: Diet   No Medications  No. Follow up visit  No.  Do you have questions or concerns about your Care? No.  Actions: * If pain score is 4 or above: No action needed, pain <4.

## 2015-02-04 ENCOUNTER — Inpatient Hospital Stay: Admission: RE | Admit: 2015-02-04 | Payer: Self-pay | Source: Ambulatory Visit

## 2015-02-05 ENCOUNTER — Ambulatory Visit (INDEPENDENT_AMBULATORY_CARE_PROVIDER_SITE_OTHER)
Admission: RE | Admit: 2015-02-05 | Discharge: 2015-02-05 | Disposition: A | Discharge status: Home / Self Care | Payer: Medicare Other | Source: Ambulatory Visit | Attending: Internal Medicine | Admitting: Internal Medicine

## 2015-02-05 DIAGNOSIS — I864 Gastric varices: Secondary | ICD-10-CM

## 2015-02-05 DIAGNOSIS — R1084 Generalized abdominal pain: Secondary | ICD-10-CM

## 2015-02-05 MED ORDER — IOHEXOL 300 MG/ML  SOLN
100.0000 mL | Freq: Once | INTRAMUSCULAR | Status: AC | PRN
  Administered 2015-02-05: 100 mL via INTRAVENOUS

## 2015-02-09 ENCOUNTER — Other Ambulatory Visit: Payer: Self-pay

## 2015-02-09 DIAGNOSIS — R933 Abnormal findings on diagnostic imaging of other parts of digestive tract: Secondary | ICD-10-CM

## 2015-02-09 DIAGNOSIS — K831 Obstruction of bile duct: Secondary | ICD-10-CM

## 2015-02-09 DIAGNOSIS — R0989 Other specified symptoms and signs involving the circulatory and respiratory systems: Secondary | ICD-10-CM

## 2015-02-09 MED ORDER — DIAZEPAM 5 MG PO TABS: ORAL_TABLET | ORAL | Status: DC

## 2015-02-09 NOTE — Telephone Encounter (Signed)
Mailed to the pt for  anxiety during procedure along with instructions

## 2015-02-11 ENCOUNTER — Encounter (HOSPITAL_COMMUNITY): Payer: Self-pay | Admitting: *Deleted

## 2015-02-11 ENCOUNTER — Other Ambulatory Visit: Payer: Self-pay | Admitting: Internal Medicine

## 2015-02-23 ENCOUNTER — Ambulatory Visit (INDEPENDENT_AMBULATORY_CARE_PROVIDER_SITE_OTHER): Payer: Medicare Other | Admitting: Nurse Practitioner

## 2015-02-23 ENCOUNTER — Ambulatory Visit (INDEPENDENT_AMBULATORY_CARE_PROVIDER_SITE_OTHER): Payer: Medicare Other

## 2015-02-23 ENCOUNTER — Encounter: Payer: Self-pay | Admitting: Nurse Practitioner

## 2015-02-23 VITALS — BP 83/49 | HR 77 | Temp 97.5°F | Ht <= 58 in | Wt 186.0 lb

## 2015-02-23 DIAGNOSIS — M79604 Pain in right leg: Secondary | ICD-10-CM | POA: Diagnosis not present

## 2015-02-23 DIAGNOSIS — E039 Hypothyroidism, unspecified: Secondary | ICD-10-CM

## 2015-02-23 DIAGNOSIS — Q909 Down syndrome, unspecified: Secondary | ICD-10-CM

## 2015-02-23 DIAGNOSIS — L719 Rosacea, unspecified: Secondary | ICD-10-CM

## 2015-02-23 DIAGNOSIS — E7800 Pure hypercholesterolemia, unspecified: Secondary | ICD-10-CM

## 2015-02-23 NOTE — Progress Notes (Addendum)
   Subjective:    Patient ID: Sara Mcgee, female    DOB: 08/24/1980, 35 y.o.   MRN: [id number]  HPI Patient in today for follow up- she has down syndrome and lives with her sister. Current medical problems include: Hypothyroidism- levothyroxine 88mcg daily- she is doing well GERD- taking omeprazole daily which helps- still has occasional flare up Obesity- try to limit calorie intact and encourage exer cise but very difficult. Hyperlipidemia- Patient has had elevated cholesterol  In the past and is not taking anything currently. Rosacea- uses metrogel on as needed basis which does help.  C/O right lower leg pain- walking increases pain.  Review of Systems  Constitutional: Negative.   HENT: Negative.   Respiratory: Negative.   Cardiovascular: Negative.   Gastrointestinal: Negative.   Genitourinary: Negative.   Neurological: Negative.   Psychiatric/Behavioral: Negative.   All other systems reviewed and are negative.      Objective:   Physical Exam  Constitutional: She is oriented to person, place, and time. She appears well-developed and well-nourished.  HENT:  Nose: Nose normal.  Mouth/Throat: Oropharynx is clear and moist.  Eyes: EOM are normal.  Neck: Trachea normal, normal range of motion and full passive range of motion without pain. Neck supple. No JVD present. Carotid bruit is not present. No thyromegaly present.  Cardiovascular: Normal rate, regular rhythm, normal heart sounds and intact distal pulses.  Exam reveals no gallop and no friction rub.   No murmur heard. Pulmonary/Chest: Effort normal and breath sounds normal.  Abdominal: Soft. Bowel sounds are normal. She exhibits no distension and no mass. There is no tenderness.  Musculoskeletal: Normal range of motion.  Lymphadenopathy:    She has no cervical adenopathy.  Neurological: She is alert and oriented to person, place, and time. She has normal reflexes.  Skin: Skin is warm and dry.  Psychiatric: She has a  normal mood and affect. Her behavior is normal. Judgment and thought content normal.    BP 83/49 mmHg  Pulse 77  Temp(Src) 97.5 F (36.4 C) (Oral)  Ht 4' 7" (1.397 m)  Wt 186 lb (84.369 kg)  BMI 43.23 kg/m2  Right knee xray- normal-Preliminary reading by Mary Jaqueline Uber, FNP  [facility]     Assessment & Plan:  1. Hypothyroidism, unspecified hypothyroidism type - Thyroid Panel With TSH  2. HYPERCHOLESTEROLEMIA Low fat diet - CMP14+EGFR - Lipid panel  3. DOWN SYNDROME  4. Rosacea  5. Right leg pain Motrin otc as needed - DG Knee 1-2 Views Right; Future    Labs pending Health maintenance reviewed Diet and exercise encouraged Continue all meds Follow up  In 6 months   Mary-Margaret Pattie Flaharty, FNP    

## 2015-02-24 LAB — CMP14+EGFR
ALBUMIN: 4.1 g/dL (ref 3.5–5.5)
ALK PHOS: 76 IU/L (ref 39–117)
ALT: 32 IU/L (ref 0–32)
AST: 17 IU/L (ref 0–40)
Albumin/Globulin Ratio: 1.4 (ref 1.1–2.5)
BUN / CREAT RATIO: 13 (ref 8–20)
BUN: 12 mg/dL (ref 6–20)
Bilirubin Total: 0.3 mg/dL (ref 0.0–1.2)
CALCIUM: 9.4 mg/dL (ref 8.7–10.2)
CO2: 27 mmol/L (ref 18–29)
CREATININE: 0.96 mg/dL (ref 0.57–1.00)
Chloride: 101 mmol/L (ref 96–106)
GFR calc Af Amer: 89 mL/min/{1.73_m2} (ref >59)
GFR, EST NON AFRICAN AMERICAN: 77 mL/min/{1.73_m2} (ref >59)
GLOBULIN, TOTAL: 3 g/dL (ref 1.5–4.5)
GLUCOSE: 78 mg/dL (ref 65–99)
Potassium: 4.6 mmol/L (ref 3.5–5.2)
SODIUM: 143 mmol/L (ref 134–144)
TOTAL PROTEIN: 7.1 g/dL (ref 6.0–8.5)

## 2015-02-24 LAB — LIPID PANEL
CHOL/HDL RATIO: 5.1 ratio — AB (ref 0.0–4.4)
CHOLESTEROL TOTAL: 157 mg/dL (ref 100–199)
HDL: 31 mg/dL — ABNORMAL LOW (ref >39)
LDL CALC: 95 mg/dL (ref 0–99)
Triglycerides: 156 mg/dL — ABNORMAL HIGH (ref 0–149)
VLDL CHOLESTEROL CAL: 31 mg/dL (ref 5–40)

## 2015-02-24 LAB — THYROID PANEL WITH TSH
Free Thyroxine Index: 2.2 (ref 1.2–4.9)
T3 UPTAKE RATIO: 26 % (ref 24–39)
T4 TOTAL: 8.4 ug/dL (ref 4.5–12.0)
TSH: 5.99 u[IU]/mL — ABNORMAL HIGH (ref 0.450–4.500)

## 2015-02-26 ENCOUNTER — Encounter (HOSPITAL_COMMUNITY): Payer: Self-pay | Admitting: Anesthesiology

## 2015-02-26 ENCOUNTER — Ambulatory Visit (HOSPITAL_COMMUNITY): Payer: Medicare Other

## 2015-02-26 ENCOUNTER — Encounter (HOSPITAL_COMMUNITY)
Admission: RE | Disposition: A | Discharge status: Home / Self Care | Payer: Self-pay | Source: Ambulatory Visit | Attending: Gastroenterology

## 2015-02-26 ENCOUNTER — Ambulatory Visit (HOSPITAL_COMMUNITY): Payer: Medicare Other | Admitting: Anesthesiology

## 2015-02-26 ENCOUNTER — Ambulatory Visit (HOSPITAL_COMMUNITY)
Admission: RE | Admit: 2015-02-26 | Discharge: 2015-02-26 | Disposition: A | Discharge status: Home / Self Care | Payer: Medicare Other | Source: Ambulatory Visit | Attending: Gastroenterology | Admitting: Gastroenterology

## 2015-02-26 DIAGNOSIS — K8070 Calculus of gallbladder and bile duct without cholecystitis without obstruction: Secondary | ICD-10-CM | POA: Diagnosis not present

## 2015-02-26 DIAGNOSIS — M79604 Pain in right leg: Secondary | ICD-10-CM | POA: Insufficient documentation

## 2015-02-26 DIAGNOSIS — L719 Rosacea, unspecified: Secondary | ICD-10-CM | POA: Insufficient documentation

## 2015-02-26 DIAGNOSIS — E669 Obesity, unspecified: Secondary | ICD-10-CM | POA: Insufficient documentation

## 2015-02-26 DIAGNOSIS — K831 Obstruction of bile duct: Secondary | ICD-10-CM | POA: Diagnosis not present

## 2015-02-26 DIAGNOSIS — K219 Gastro-esophageal reflux disease without esophagitis: Secondary | ICD-10-CM | POA: Diagnosis not present

## 2015-02-26 DIAGNOSIS — K319 Disease of stomach and duodenum, unspecified: Secondary | ICD-10-CM | POA: Diagnosis present

## 2015-02-26 DIAGNOSIS — R933 Abnormal findings on diagnostic imaging of other parts of digestive tract: Secondary | ICD-10-CM

## 2015-02-26 DIAGNOSIS — E7800 Pure hypercholesterolemia, unspecified: Secondary | ICD-10-CM | POA: Insufficient documentation

## 2015-02-26 DIAGNOSIS — K8040 Calculus of bile duct with cholecystitis, unspecified, without obstruction: Secondary | ICD-10-CM | POA: Diagnosis not present

## 2015-02-26 DIAGNOSIS — Q909 Down syndrome, unspecified: Secondary | ICD-10-CM | POA: Diagnosis not present

## 2015-02-26 DIAGNOSIS — E039 Hypothyroidism, unspecified: Secondary | ICD-10-CM | POA: Diagnosis not present

## 2015-02-26 DIAGNOSIS — D490 Neoplasm of unspecified behavior of digestive system: Secondary | ICD-10-CM | POA: Insufficient documentation

## 2015-02-26 DIAGNOSIS — E785 Hyperlipidemia, unspecified: Secondary | ICD-10-CM | POA: Insufficient documentation

## 2015-02-26 DIAGNOSIS — Z6841 Body Mass Index (BMI) 40.0 and over, adult: Secondary | ICD-10-CM | POA: Insufficient documentation

## 2015-02-26 DIAGNOSIS — K8050 Calculus of bile duct without cholangitis or cholecystitis without obstruction: Secondary | ICD-10-CM

## 2015-02-26 HISTORY — PX: EUS: SHX5427

## 2015-02-26 HISTORY — PX: ENDOSCOPIC RETROGRADE CHOLANGIOPANCREATOGRAPHY (ERCP) WITH PROPOFOL: SHX5810

## 2015-02-26 HISTORY — DX: Other skin changes: R23.8

## 2015-02-26 SURGERY — UPPER ENDOSCOPIC ULTRASOUND (EUS) LINEAR
Anesthesia: Monitor Anesthesia Care

## 2015-02-26 MED ORDER — SODIUM CHLORIDE 0.9 % IV SOLN: INTRAVENOUS | Status: DC

## 2015-02-26 MED ORDER — ONDANSETRON HCL 4 MG/2ML IJ SOLN
INTRAMUSCULAR | Status: DC | PRN
  Administered 2015-02-26: 4 mg via INTRAVENOUS

## 2015-02-26 MED ORDER — ONDANSETRON HCL 4 MG/2ML IJ SOLN
INTRAMUSCULAR | Status: AC
  Filled 2015-02-26: qty 2

## 2015-02-26 MED ORDER — PROPOFOL 10 MG/ML IV BOLUS
INTRAVENOUS | Status: DC | PRN
  Administered 2015-02-26: 30 mg via INTRAVENOUS
  Administered 2015-02-26: 120 mg via INTRAVENOUS

## 2015-02-26 MED ORDER — ONDANSETRON 4 MG PO TBDP
4.0000 mg | ORAL_TABLET | Freq: Once | ORAL | Status: AC
  Administered 2015-02-26: 4 mg via ORAL
  Filled 2015-02-26: qty 1

## 2015-02-26 MED ORDER — FENTANYL CITRATE (PF) 100 MCG/2ML IJ SOLN
INTRAMUSCULAR | Status: DC | PRN
  Administered 2015-02-26: 25 ug via INTRAVENOUS
  Administered 2015-02-26: 50 ug via INTRAVENOUS

## 2015-02-26 MED ORDER — MIDAZOLAM HCL 5 MG/ML IJ SOLN
INTRAMUSCULAR | Status: AC
  Filled 2015-02-26: qty 1

## 2015-02-26 MED ORDER — CIPROFLOXACIN IN D5W 400 MG/200ML IV SOLN
INTRAVENOUS | Status: AC
Start: 2015-02-26 — End: 2015-02-26
  Filled 2015-02-26: qty 200

## 2015-02-26 MED ORDER — FENTANYL CITRATE (PF) 100 MCG/2ML IJ SOLN
INTRAMUSCULAR | Status: AC
  Filled 2015-02-26: qty 2

## 2015-02-26 MED ORDER — SODIUM CHLORIDE 0.9 % IV SOLN
INTRAVENOUS | Status: DC | PRN
  Administered 2015-02-26: 10 mL

## 2015-02-26 MED ORDER — LACTATED RINGERS IV SOLN
INTRAVENOUS | Status: DC | PRN
  Administered 2015-02-26 (×2): via INTRAVENOUS

## 2015-02-26 MED ORDER — CIPROFLOXACIN IN D5W 400 MG/200ML IV SOLN
INTRAVENOUS | Status: DC | PRN
  Administered 2015-02-26: 400 mg via INTRAVENOUS

## 2015-02-26 MED ORDER — MIDAZOLAM HCL 5 MG/ML IJ SOLN: 0.5000 mg | INTRAMUSCULAR | Status: DC | PRN

## 2015-02-26 MED ORDER — LIDOCAINE HCL (CARDIAC) 20 MG/ML IV SOLN
INTRAVENOUS | Status: DC | PRN
  Administered 2015-02-26: 40 mg via INTRAVENOUS

## 2015-02-26 MED ORDER — MIDAZOLAM HCL 2 MG/2ML IJ SOLN
INTRAMUSCULAR | Status: AC
  Filled 2015-02-26: qty 2

## 2015-02-26 MED ORDER — GLUCAGON HCL RDNA (DIAGNOSTIC) 1 MG IJ SOLR
INTRAMUSCULAR | Status: AC
  Filled 2015-02-26: qty 1

## 2015-02-26 MED ORDER — PROPOFOL 10 MG/ML IV BOLUS
INTRAVENOUS | Status: AC
  Filled 2015-02-26: qty 20

## 2015-02-26 MED ORDER — MIDAZOLAM HCL 5 MG/5ML IJ SOLN
INTRAMUSCULAR | Status: DC | PRN
  Administered 2015-02-26 (×2): 1 mg via INTRAVENOUS

## 2015-02-26 MED ORDER — INDOMETHACIN 50 MG RE SUPP
RECTAL | Status: AC
  Filled 2015-02-26: qty 1

## 2015-02-26 MED ORDER — LIDOCAINE HCL (CARDIAC) 20 MG/ML IV SOLN
INTRAVENOUS | Status: AC
  Filled 2015-02-26: qty 5

## 2015-02-26 MED ORDER — INDOMETHACIN 50 MG RE SUPP
RECTAL | Status: DC | PRN
Start: 2015-02-26 — End: 2015-02-26
  Administered 2015-02-26: 100 mg via RECTAL

## 2015-02-26 MED ORDER — SUCCINYLCHOLINE CHLORIDE 20 MG/ML IJ SOLN
INTRAMUSCULAR | Status: DC | PRN
  Administered 2015-02-26: 100 mg via INTRAVENOUS

## 2015-02-26 NOTE — Op Note (Signed)
[facility] [place], [address]   ENDOSCOPIC ULTRASOUND PROCEDURE REPORT PATIENT: Sara, Mcgee  MR#: [id number] BIRTHDATE: 01-13-1980  GENDER: female ENDOSCOPIST: [name], MD REFERRED BY:  Zenovia Jarred, MD PROCEDURE DATE:  02/26/2015 PROCEDURE:   Upper EUS w/FNA ASA CLASS:      Class III INDICATIONS:   1.  mass in proximal stomach (recent EGD ? varices); CT scan follow up showed the mass (unclear etiology) and also ? of CBD stone, + cholelithiasis. MEDICATIONS: Per Anesthesia DESCRIPTION OF PROCEDURE:   After the risks benefits and alternatives of the procedure were  explained, informed consent was obtained. The patient was then placed in the left, lateral, decubitus postion and IV sedation was administered. Throughout the procedure, the patients blood pressure, pulse and oxygen saturations were monitored continuously.  Under direct visualization, the PENTAX EUS SCOPE  endoscope was introduced through the mouth  and advanced to the second portion of the duodenum .  Water was used as necessary to provide an acoustic interface.  Upon completion of the imaging, water was removed and the patient was sent to the recovery room in satisfactory condition.  Endoscopic findings: 1. Lobular, submucosal appearing mass in very proximal stomach, just distal to the GE junction. This was 3-4cm across. 2. Portal hypertensive appearing changes throughout the stomach.  EUS findings: 1. Shadowing, 4-51mm calcification in the mid to distal CBD.  This was very suspicious for choledocholiathisis. 2. CBD was normal otherwise, non-dilated 3. Gallstones in the gallbladder. 4. Pancreatic parenchyma was normal appearing 5. The submucosal mass in proximal stomach described above correlated with a hypoechoic, solid appearing, lobular mass containing several small calcifications. The was 4cm across maximally.  The mass communicates with the muscularis propria  layer of the gastric wall.  There is no flow within the mass (by doppler).  The mass was sampled with 3 transgastric passes of a 22 gauge EUS FNA Aquire needle, suction. 6. No perigastric adenopathy.  ENDOSCOPIC IMPRESSION: Await final cytology.  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 Choledocholithiasis.  Will proceed with ERCP now.   _______________________________ eSigned:  [name], MD 02/26/2015 11:56 AM

## 2015-02-26 NOTE — Progress Notes (Signed)
Pt feeling better no nausea at this time or emesis. Dc'd home with sister in law, her guardian. [facility]

## 2015-02-26 NOTE — Anesthesia Procedure Notes (Signed)
Procedure Name: Intubation Date/Time: 02/26/2015 10:41 AM Performed by: Glory Buff Pre-anesthesia Checklist: Patient identified, Emergency Drugs available, Suction available and Patient being monitored Patient Re-evaluated:Patient Re-evaluated prior to inductionOxygen Delivery Method: Circle System Utilized Preoxygenation: Pre-oxygenation with 100% oxygen Intubation Type: IV induction Ventilation: Mask ventilation without difficulty Laryngoscope Size: Miller and 3 Grade View: Grade I Tube type: Oral Tube size: 7.0 mm Number of attempts: 1 Airway Equipment and Method: Stylet and Oral airway Placement Confirmation: ETT inserted through vocal cords under direct vision,  positive ETCO2 and breath sounds checked- equal and bilateral Secured at: 20 cm Tube secured with: Tape Dental Injury: Teeth and Oropharynx as per pre-operative assessment

## 2015-02-26 NOTE — Interval H&P Note (Signed)
History and Physical Interval Note:  02/26/2015 10:23 AM  Sara Mcgee  has presented today for surgery, with the diagnosis of abnormal stomach, CBD abnormality  The various methods of treatment have been discussed with the patient and family. After consideration of risks, benefits and other options for treatment, the patient has consented to  Procedure(s): UPPER ENDOSCOPIC ULTRASOUND (EUS) LINEAR (N/A) ENDOSCOPIC RETROGRADE CHOLANGIOPANCREATOGRAPHY (ERCP) WITH PROPOFOL (N/A) as a surgical intervention .  The patient's history has been reviewed, patient examined, no change in status, stable for surgery.  I have reviewed the patient's chart and labs.  Questions were answered to the patient's satisfaction.     [name]

## 2015-02-26 NOTE — Anesthesia Postprocedure Evaluation (Signed)
Anesthesia Post Note  Patient: Sara Mcgee  Procedure(s) Performed: Procedure(s) (LRB): UPPER ENDOSCOPIC ULTRASOUND (EUS) LINEAR (N/A) ENDOSCOPIC RETROGRADE CHOLANGIOPANCREATOGRAPHY (ERCP) WITH PROPOFOL (N/A)  Patient location during evaluation: PACU Anesthesia Type: MAC Level of consciousness: awake and alert Pain management: pain level controlled Vital Signs Assessment: post-procedure vital signs reviewed and stable Respiratory status: spontaneous breathing, nonlabored ventilation, respiratory function stable and patient connected to nasal cannula oxygen Cardiovascular status: stable and blood pressure returned to baseline Anesthetic complications: no    Last Vitals:  Filed Vitals:   02/26/15 1300 02/26/15 1310  BP: 126/67 119/83  Pulse: 93 85  Temp:    Resp: 16 14    Last Pain: There were no vitals filed for this visit.               Desean Heemstra J

## 2015-02-26 NOTE — Progress Notes (Signed)
Pt with nausea and small amount of frothy emesis post procedure. IV dc'd. Spoke with anesthesia and got an order for SL Zofran. [facility]

## 2015-02-26 NOTE — Anesthesia Preprocedure Evaluation (Signed)
Anesthesia Evaluation  Patient identified by MRN, date of birth, ID band Patient awake    Reviewed: Allergy & Precautions, NPO status , Patient's Chart, lab work & pertinent test results  Airway Mallampati: II  TM Distance: >3 FB Neck ROM: Full    Dental no notable dental hx.    Pulmonary neg pulmonary ROS,    Pulmonary exam normal breath sounds clear to auscultation       Cardiovascular negative cardio ROS Normal cardiovascular exam Rhythm:Regular Rate:Normal     Neuro/Psych  Neuromuscular disease negative psych ROS   GI/Hepatic Neg liver ROS, GERD  Medicated,  Endo/Other  Hypothyroidism Morbid obesity  Renal/GU negative Renal ROS  negative genitourinary   Musculoskeletal negative musculoskeletal ROS (+)   Abdominal (+) + obese,   Peds negative pediatric ROS (+)  Hematology negative hematology ROS (+)   Anesthesia Other Findings   Reproductive/Obstetrics negative OB ROS                             Anesthesia Physical Anesthesia Plan  ASA: III  Anesthesia Plan: MAC   Post-op Pain Management:    Induction: Intravenous  Airway Management Planned: Natural Airway  Additional Equipment:   Intra-op Plan:   Post-operative Plan:   Informed Consent: I have reviewed the patients History and Physical, chart, labs and discussed the procedure including the risks, benefits and alternatives for the proposed anesthesia with the patient or authorized representative who has indicated his/her understanding and acceptance.   Dental advisory given  Plan Discussed with: CRNA  Anesthesia Plan Comments:         Anesthesia Quick Evaluation

## 2015-02-26 NOTE — Op Note (Signed)
[facility] [place], [address]   ERCP PROCEDURE REPORT  PATIENT: Sara Mcgee, Sara Mcgee  MR#: [id number] BIRTHDATE: June 27, 1980  GENDER: female ENDOSCOPIST: [name], MD REFERRED BY: Zenovia Jarred, MD PROCEDURE DATE:  02/26/2015 PROCEDURE:   ERCP with sphincterotomy/papillotomy and ERCP with removal of calculus/calculi INDICATIONS:CBD stone suspected on recent CT, confirmed on todays EUS; also cholelithiasis. MEDICATIONS: Per Anesthesia and cipro 400mg  IV, indomethacin 100mg  PR TOPICAL ANESTHETIC:   none  DESCRIPTION OF PROCEDURE:     Physical exam was performed.  Informed consent was obtained from the patient after explaining the benefits, risks, and alternatives to the procedure.  The patient was connected to the monitor and placed in the semi-prone position. IV medicine was administered through an indwelling cannula and oxygen via endotracheal tube.  After administration of sedation, the patients esophagus was intubated and the Pentax Ercp Scope [id number]  endoscope was advanced under direct visualization to the second portion of the duodenum without detailed examination of the UGI tract. The major papilla was normal appearing.  A 44 Autotome over a .035 hydrawire was used to cannulate the bile duct and contrast was injected. Cholangiogram revealed a non-dilated biliary tree. There were 2-3 small floating filling defects (stones) in the distal CBD. The cystic duct was patent. The gallbladder opacified and was filled with dozens of small stones.  An adequate biliary sphincterotomy was performed and the the bile duct was swept several times with a biliary retrieval balloon.  Three small black stones were delivered into the duodenum. There was no purulence.  A completion, occlusion cholangiogram was normal.  The pancreatic duct was never injected with dye or cannulated with wire.   The scope was then completely withdrawn from the patient and  the procedure terminated.     COMPLICATIONS:    None  ENDOSCOPIC IMPRESSION/RECOMMENDATIONS: 1. Choledocholithiasis, treated today with biliary sphincterotomy and balloon sweeping of the bile duct. 2. Cholelithiasis (dozens of small stones in the gallbladder); she will need eventual cholecystectomy, would await further data about the proximal gastric mass before referral. EUS FNA results pending.    _______________________________ eSigned:  [name], MD 02/26/2015 12:34 PM

## 2015-02-26 NOTE — H&P (View-Only) (Signed)
   Subjective:    Patient ID: Sara Mcgee, female    DOB: 1980/07/15, 35 y.o.   MRN: [id number]  HPI Patient in today for follow up- she has down syndrome and lives with her sister. Current medical problems include: Hypothyroidism- levothyroxine 53mg daily- she is doing well GERD- taking omeprazole daily which helps- still has occasional flare up Obesity- try to limit calorie intact and encourage exer cise but very difficult. Hyperlipidemia- Patient has had elevated cholesterol  In the past and is not taking anything currently. Rosacea- uses metrogel on as needed basis which does help.  C/O right lower leg pain- walking increases pain.  Review of Systems  Constitutional: Negative.   HENT: Negative.   Respiratory: Negative.   Cardiovascular: Negative.   Gastrointestinal: Negative.   Genitourinary: Negative.   Neurological: Negative.   Psychiatric/Behavioral: Negative.   All other systems reviewed and are negative.      Objective:   Physical Exam  Constitutional: She is oriented to person, place, and time. She appears well-developed and well-nourished.  HENT:  Nose: Nose normal.  Mouth/Throat: Oropharynx is clear and moist.  Eyes: EOM are normal.  Neck: Trachea normal, normal range of motion and full passive range of motion without pain. Neck supple. No JVD present. Carotid bruit is not present. No thyromegaly present.  Cardiovascular: Normal rate, regular rhythm, normal heart sounds and intact distal pulses.  Exam reveals no gallop and no friction rub.   No murmur heard. Pulmonary/Chest: Effort normal and breath sounds normal.  Abdominal: Soft. Bowel sounds are normal. She exhibits no distension and no mass. There is no tenderness.  Musculoskeletal: Normal range of motion.  Lymphadenopathy:    She has no cervical adenopathy.  Neurological: She is alert and oriented to person, place, and time. She has normal reflexes.  Skin: Skin is warm and dry.  Psychiatric: She has a  normal mood and affect. Her behavior is normal. Judgment and thought content normal.    BP 83/49 mmHg  Pulse 77  Temp(Src) 97.5 F (36.4 C) (Oral)  Ht 4' 7" (1.397 m)  Wt 186 lb (84.369 kg)  BMI 43.23 kg/m2  Right knee xray- normal-Preliminary reading by MRonnald Collum FNP  [facility]    Assessment & Plan:  1. Hypothyroidism, unspecified hypothyroidism type - Thyroid Panel With TSH  2. HYPERCHOLESTEROLEMIA Low fat diet - CMP14+EGFR - Lipid panel  3. DOWN SYNDROME  4. Rosacea  5. Right leg pain Motrin otc as needed - DG Knee 1-2 Views Right; Future    Labs pending Health maintenance reviewed Diet and exercise encouraged Continue all meds Follow up  In 6 months   [place] FNP

## 2015-02-26 NOTE — Discharge Instructions (Signed)

## 2015-02-26 NOTE — Transfer of Care (Signed)
Immediate Anesthesia Transfer of Care Note  Patient: Sara Mcgee  Procedure(s) Performed: Procedure(s): UPPER ENDOSCOPIC ULTRASOUND (EUS) LINEAR (N/A) ENDOSCOPIC RETROGRADE CHOLANGIOPANCREATOGRAPHY (ERCP) WITH PROPOFOL (N/A)  Patient Location: PACU  Anesthesia Type:General  Level of Consciousness: awake, alert  and oriented  Airway & Oxygen Therapy: Patient Spontanous Breathing and Patient connected to face mask oxygen  Post-op Assessment: Report given to RN and Post -op Vital signs reviewed and stable  Post vital signs: Reviewed and stable  Last Vitals:  Filed Vitals:   02/26/15 0955  BP: 130/73  Pulse: 93  Temp: 36.7 C  Resp: 19    Complications: No apparent anesthesia complications

## 2015-02-27 ENCOUNTER — Encounter (HOSPITAL_COMMUNITY): Payer: Self-pay | Admitting: Gastroenterology

## 2015-03-18 ENCOUNTER — Other Ambulatory Visit: Payer: Self-pay | Admitting: Nurse Practitioner

## 2015-03-18 DIAGNOSIS — L719 Rosacea, unspecified: Secondary | ICD-10-CM

## 2015-03-19 MED ORDER — METRONIDAZOLE 0.75 % EX CREA: 1.0000 "application " | TOPICAL_CREAM | Freq: Every day | CUTANEOUS | Status: DC | PRN

## 2015-03-19 NOTE — Telephone Encounter (Signed)
Pt aware Rx has been sent to pharmacy 

## 2015-03-19 NOTE — Telephone Encounter (Signed)
Refill sent in

## 2015-03-23 ENCOUNTER — Other Ambulatory Visit: Payer: Self-pay | Admitting: Nurse Practitioner

## 2015-04-06 ENCOUNTER — Other Ambulatory Visit: Payer: Self-pay | Admitting: General Surgery

## 2015-04-15 NOTE — Pre-Procedure Instructions (Signed)
MERLY PISTOLE  04/15/2015      [id number] - [place], [place] [address] Phone: [phone] Fax: [phone]  [id number] - [place], [place] [address] [place] [address] Phone: [phone] Fax: [phone]    Your procedure is scheduled on April 13  Report to [place] at 530 A.M.  Call this number if you have problems the morning of surgery:  [phone]   Remember:  Do not eat food or drink liquids after midnight.  Take these medicines the morning of surgery with A SIP OF WATER Levothyroxine(Synthroid), Omeprazole (Prilosec)   Do not wear jewelry, make-up or nail polish.  Do not wear lotions, powders, or perfumes.    Do not shave 48 hours prior to surgery.    Do not bring valuables to the hospital.   [facility] is not responsible for any belongings or valuables.  Contacts, dentures or bridgework may not be worn into surgery.  Leave your suitcase in the car.  After surgery it may be brought to your room.  For patients admitted to the hospital, discharge time will be determined by your treatment team.  Patients discharged the day of surgery will not be allowed to drive home.    Special instructions:  [place] - Preparing for Surgery  Before surgery, you can play an important role.  Because skin is not sterile, your skin needs to be as free of germs as possible.  You can reduce the number of germs on you skin by washing with CHG (chlorahexidine gluconate) soap before surgery.  CHG is an antiseptic cleaner which kills germs and bonds with the skin to continue killing germs even after washing.  Please DO NOT use if you have an allergy to CHG or antibacterial soaps.  If your skin becomes reddened/irritated stop using the CHG and inform your nurse when you arrive at Short Stay.  Do not shave (including legs and underarms) for at least 48 hours prior to the  first CHG shower.  You may shave your face.  Please follow these instructions carefully:   1.  Shower with CHG Soap the night before surgery and the morning of Surgery.  2.  If you choose to wash your hair, wash your hair first as usual with your normal shampoo.  3.  After you shampoo, rinse your hair and body thoroughly to remove the Shampoo.  4.  Use CHG as you would any other liquid soap.  You can apply chg directly to the skin and wash gently with scrungie or a clean washcloth.  5.  Apply the CHG Soap to your body ONLY FROM THE NECK DOWN.  Do not use on open wounds or open sores.  Avoid contact with your eyes, ears, mouth and genitals (private parts).  Wash genitals (private parts)       with your normal soap.  6.  Wash thoroughly, paying special attention to the area where your surgery will be performed.  7.  Thoroughly rinse your body with warm water from the neck down.  8.  DO NOT shower/wash with your normal soap after using and rinsing off the CHG Soap.  9.  Pat yourself dry with a clean towel.            10.  Wear clean pajamas.            11.  Place clean sheets  on your bed the night of your first shower and do not sleep with pets.  Day of Surgery  Do not apply any lotions/deoderants the morning of surgery.  Please wear clean clothes to the hospital/surgery center.     Please read over the following fact sheets that you were given. Pain Booklet, Coughing and Deep Breathing and Surgical Site Infection Prevention

## 2015-04-16 ENCOUNTER — Encounter (HOSPITAL_COMMUNITY): Payer: Self-pay

## 2015-04-16 ENCOUNTER — Telehealth: Payer: Self-pay | Admitting: Nurse Practitioner

## 2015-04-16 ENCOUNTER — Encounter (HOSPITAL_COMMUNITY)
Admission: RE | Admit: 2015-04-16 | Discharge: 2015-04-16 | Disposition: A | Discharge status: Home / Self Care | Payer: Medicare Other | Source: Ambulatory Visit | Attending: General Surgery | Admitting: General Surgery

## 2015-04-16 DIAGNOSIS — E785 Hyperlipidemia, unspecified: Secondary | ICD-10-CM | POA: Diagnosis not present

## 2015-04-16 DIAGNOSIS — Z01812 Encounter for preprocedural laboratory examination: Secondary | ICD-10-CM | POA: Insufficient documentation

## 2015-04-16 DIAGNOSIS — Q909 Down syndrome, unspecified: Secondary | ICD-10-CM | POA: Diagnosis not present

## 2015-04-16 DIAGNOSIS — K219 Gastro-esophageal reflux disease without esophagitis: Secondary | ICD-10-CM | POA: Insufficient documentation

## 2015-04-16 DIAGNOSIS — E039 Hypothyroidism, unspecified: Secondary | ICD-10-CM | POA: Insufficient documentation

## 2015-04-16 DIAGNOSIS — Z01818 Encounter for other preprocedural examination: Secondary | ICD-10-CM | POA: Diagnosis present

## 2015-04-16 DIAGNOSIS — L719 Rosacea, unspecified: Secondary | ICD-10-CM

## 2015-04-16 DIAGNOSIS — C49A0 Gastrointestinal stromal tumor, unspecified site: Secondary | ICD-10-CM | POA: Insufficient documentation

## 2015-04-16 DIAGNOSIS — Z79899 Other long term (current) drug therapy: Secondary | ICD-10-CM | POA: Diagnosis not present

## 2015-04-16 DIAGNOSIS — R1084 Generalized abdominal pain: Secondary | ICD-10-CM | POA: Insufficient documentation

## 2015-04-16 HISTORY — DX: Unspecified hearing loss, unspecified ear: H91.90

## 2015-04-16 HISTORY — DX: Nausea with vomiting, unspecified: R11.2

## 2015-04-16 HISTORY — DX: Other specified postprocedural states: Z98.890

## 2015-04-16 LAB — COMPREHENSIVE METABOLIC PANEL
ALBUMIN: 3.8 g/dL (ref 3.5–5.0)
ALK PHOS: 65 U/L (ref 38–126)
ALT: 23 U/L (ref 14–54)
AST: 22 U/L (ref 15–41)
Anion gap: 12 (ref 5–15)
BUN: 13 mg/dL (ref 6–20)
CALCIUM: 9.1 mg/dL (ref 8.9–10.3)
CHLORIDE: 105 mmol/L (ref 101–111)
CO2: 25 mmol/L (ref 22–32)
CREATININE: 0.94 mg/dL (ref 0.44–1.00)
GFR calc Af Amer: >60 mL/min (ref >60)
GFR calc non Af Amer: >60 mL/min (ref >60)
GLUCOSE: 87 mg/dL (ref 65–99)
Potassium: 3.8 mmol/L (ref 3.5–5.1)
SODIUM: 142 mmol/L (ref 135–145)
Total Bilirubin: 0.8 mg/dL (ref 0.3–1.2)
Total Protein: 7.7 g/dL (ref 6.5–8.1)

## 2015-04-16 LAB — CBC WITH DIFFERENTIAL/PLATELET
BASOS ABS: 0.1 10*3/uL (ref 0.0–0.1)
Basophils Relative: 1 %
EOS ABS: 0.1 10*3/uL (ref 0.0–0.7)
EOS PCT: 1 %
HCT: 45.5 % (ref 36.0–46.0)
HEMOGLOBIN: 15.9 g/dL — AB (ref 12.0–15.0)
Lymphocytes Relative: 30 %
Lymphs Abs: 1.6 10*3/uL (ref 0.7–4.0)
MCH: 34.1 pg — AB (ref 26.0–34.0)
MCHC: 34.9 g/dL (ref 30.0–36.0)
MCV: 97.6 fL (ref 78.0–100.0)
Monocytes Absolute: 0.3 10*3/uL (ref 0.1–1.0)
Monocytes Relative: 6 %
Neutro Abs: 3.3 10*3/uL (ref 1.7–7.7)
Neutrophils Relative %: 62 %
PLATELETS: 274 10*3/uL (ref 150–400)
RBC: 4.66 MIL/uL (ref 3.87–5.11)
RDW: 13.5 % (ref 11.5–15.5)
WBC: 5.4 10*3/uL (ref 4.0–10.5)

## 2015-04-16 LAB — PROTIME-INR
INR: 1.07 (ref 0.00–1.49)
Prothrombin Time: 14.1 seconds (ref 11.6–15.2)

## 2015-04-16 LAB — HCG, SERUM, QUALITATIVE: PREG SERUM: NEGATIVE

## 2015-04-16 MED ORDER — KETOCONAZOLE 2 % EX CREA: 1.0000 "application " | TOPICAL_CREAM | Freq: Every day | CUTANEOUS | Status: DC

## 2015-04-16 MED ORDER — METRONIDAZOLE 0.75 % EX CREA: 1.0000 "application " | TOPICAL_CREAM | Freq: Every day | CUTANEOUS | Status: DC | PRN

## 2015-04-16 NOTE — Telephone Encounter (Signed)
rx sent to pharmacy

## 2015-04-16 NOTE — Progress Notes (Signed)
PCP - Chevis Pretty Cardiologist - denies  EKG - denies CXR - denies Echo/stress test/cardiac cath- denies  Patient denies chest pain and shortness of breath at PAT appointment.  Patient's POA, Harold Barban was present during PAT appointment and helped patient give history.

## 2015-04-17 NOTE — Progress Notes (Signed)
Anesthesia Chart Review:  Pt is a 35 year old female scheduled for laparoscopic cholecystectomy with intraoperative cholangiogram, laparoscopic partial gastrectomy, EGD on 04/23/2015 with Dr. Barry Dienes.   PCP is Mary-Margaret Hassell Done, FNP  PMH includes:  Down syndrome, hypothyroid, hyperlipidemia, GERD. Hard of hearing. Never smoker. BMI 41  Medications include: levothyroxine, prilosec  Preoperative labs reviewed.    C-spine films from 02/16/12 are negative for cervical spine instability  EKG 03/05/14: Sinus  Rhythm. RSR(V1) -nondiagnostic. PROBABLY NORMAL  Getting an IV on pt may be a challenge.  For prior procedures, pt has required versed prior to allowing IV to be placed.   If no changes, I anticipate pt can proceed with surgery as scheduled.   Willeen Cass, FNP-BC [facility]/Anesthesiology Phone: [phone] 04/17/2015 9:58 AM

## 2015-04-21 ENCOUNTER — Encounter: Payer: Self-pay | Admitting: Nurse Practitioner

## 2015-04-21 ENCOUNTER — Ambulatory Visit (INDEPENDENT_AMBULATORY_CARE_PROVIDER_SITE_OTHER): Payer: Medicare Other | Admitting: Nurse Practitioner

## 2015-04-21 VITALS — BP 102/64 | Temp 97.0°F | Ht <= 58 in | Wt 185.0 lb

## 2015-04-21 DIAGNOSIS — H6092 Unspecified otitis externa, left ear: Secondary | ICD-10-CM

## 2015-04-21 LAB — CBC WITH DIFFERENTIAL/PLATELET
Basophils Absolute: 0 10*3/uL (ref 0.0–0.2)
Basos: 1 %
EOS (ABSOLUTE): 0.1 10*3/uL (ref 0.0–0.4)
EOS: 1 %
HEMATOCRIT: 45.1 % (ref 34.0–46.6)
Hemoglobin: 15.2 g/dL (ref 11.1–15.9)
IMMATURE GRANULOCYTES: 0 %
Immature Grans (Abs): 0 10*3/uL (ref 0.0–0.1)
Lymphocytes Absolute: 1.8 10*3/uL (ref 0.7–3.1)
Lymphs: 31 %
MCH: 33.4 pg — ABNORMAL HIGH (ref 26.6–33.0)
MCHC: 33.7 g/dL (ref 31.5–35.7)
MCV: 99 fL — ABNORMAL HIGH (ref 79–97)
MONOS ABS: 0.3 10*3/uL (ref 0.1–0.9)
Monocytes: 6 %
NEUTROS PCT: 61 %
Neutrophils Absolute: 3.4 10*3/uL (ref 1.4–7.0)
PLATELETS: 290 10*3/uL (ref 150–379)
RBC: 4.55 x10E6/uL (ref 3.77–5.28)
RDW: 14.3 % (ref 12.3–15.4)
WBC: 5.6 10*3/uL (ref 3.4–10.8)

## 2015-04-21 MED ORDER — CIPROFLOXACIN-DEXAMETHASONE 0.3-0.1 % OT SUSP: 4.0000 [drp] | Freq: Two times a day (BID) | OTIC | Status: DC

## 2015-04-21 NOTE — Telephone Encounter (Signed)
Patient was seen for visit today and informed that prescription was sent in

## 2015-04-21 NOTE — Progress Notes (Signed)
   Subjective:    Patient ID: Sara Mcgee, female    DOB: Nov 15, 1980, 35 y.o.   MRN: [id number]  HPI Patient is brought in by her sister with c/o ear pain- she had an ear infection 2 weeks ago on right but now left ear is hurting. She is scheduled to have surgery this Thursday to have gall bladder and partial gastrectomy. Her surgeon wanted to make sure she had no infection prior to surgery.   Review of Systems  Constitutional: Negative.   HENT: Negative.   Respiratory: Negative.   Cardiovascular: Negative.   Genitourinary: Negative.   Neurological: Negative.   Psychiatric/Behavioral: Negative.   All other systems reviewed and are negative.      Objective:   Physical Exam  Constitutional: She is oriented to person, place, and time. She appears well-developed and well-nourished. No distress.  HENT:  Right Ear: Hearing, tympanic membrane, external ear and ear canal normal.  Left Ear: There is drainage. Tympanic membrane is erythematous.  Nose: Mucosal edema and rhinorrhea present. Right sinus exhibits no maxillary sinus tenderness and no frontal sinus tenderness. Left sinus exhibits no maxillary sinus tenderness and no frontal sinus tenderness.  Mouth/Throat: Uvula is midline, oropharynx is clear and moist and mucous membranes are normal.  Cardiovascular: Normal rate, regular rhythm and normal heart sounds.   Pulmonary/Chest: Effort normal and breath sounds normal.  Neurological: She is alert and oriented to person, place, and time.  Skin: Skin is warm.  Psychiatric: She has a normal mood and affect. Her behavior is normal. Judgment and thought content normal.    BP 102/64 mmHg  Temp(Src) 97 F (36.1 C) (Oral)  Ht 4\' 8"  (1.422 m)  Wt 185 lb (83.915 kg)  BMI 41.50 kg/m2  LMP 02/22/2015 (Approximate)       Assessment & Plan:   1. Left otitis externa    Meds ordered this encounter  Medications  . ciprofloxacin-dexamethasone (CIPRODEX) otic suspension    Sig: Place 4  drops into the left ear 2 (two) times daily.    Dispense:  7.5 mL    Refill:  0    Order Specific Question:  Supervising Provider    Answer:  Chipper Herb [[id number]]   Force fluids Will fax cbc to surgeon when get- Dr.  Barry Dienes  ( attention Sunday Spillers  [phone] ) RTO prn  Mary-Margaret Hassell Done, FNP

## 2015-04-22 MED ORDER — CEFAZOLIN SODIUM-DEXTROSE 2-4 GM/100ML-% IV SOLN
2.0000 g | INTRAVENOUS | Status: AC
  Administered 2015-04-23: 2 g via INTRAVENOUS
  Filled 2015-04-22: qty 100

## 2015-04-22 NOTE — H&P (Signed)
Sara Mcgee 04/06/2015 3:43 PM Location: [facility] Patient #: [id number] DOB: [birth year] Single / Language: Sara Mcgee / Race: White Female   History of Present Illness Stark Klein MD; 04/06/2015 6:57 PM) The patient is a 35 year old female who presents with abdominal pain. PRIOR HISTORY [She is referred by Dr. Oretha Caprice because of a proximal gastric, lesser curve spindle cell tumor concerning for a just just distal to the GE junction as well as cholelithiasis and choledocholithiasis. She had problems with dysphagia and underwent an upper endoscopy which suggested that she had some gastric varices/thickening at the lesser curve of the stomach. CT scan was performed. Mild to moderate hepatic steatosis was noted with borderline hepatomegaly present. No cirrhosis or focal liver lesion. Multiple gallstones were seen. There appeared to be a distal common bile duct calcification consistent are concerning for a stone. Soft tissue fullness along the lesser curvature of the proximal stomach within calcifications was noted. She underwent endoscopic ultrasound and biopsy of this lesion. The lesion was approximated 4-5 cm in size and was on the lesser curve just distal to the gastroesophageal junction. 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 She has Down's syndrome.]  The patient is still having epigastric pain and episodes of pain in which she becomes pale and gets sweaty. It is difficult to assess her exact symptoms due to her Down's syndrome. She also complains of reflux and nausea frequently. She spends most of the week with her sister in law who does a good job keeping her from eating a lot of high fat foods. However, her parents give her whatever she wants.      Allergies Elbert Ewings, [place]; 04/06/2015 3:44 PM) No Known Drug  Allergies03/06/2015  Medication History Elbert Ewings, [place]; 04/06/2015 3:44 PM) Levothyroxine Sodium (88MCG Tablet, Oral) Active. Loperamide HCl (2MG  Capsule, Oral) Active. Omeprazole (40MG  Capsule DR, Oral) Active. Imodium (2MG  Capsule, Oral) Active. MetroNIDAZOLE (0.75% Lotion, External) Active. Medications Reconciled    Review of Systems Stark Klein MD; 04/06/2015 6:57 PM) All other systems negative  Vitals Elbert Ewings CMA; 04/06/2015 3:44 PM) 04/06/2015 3:44 PM Weight: 186 lb Height: 59in Body Surface Area: 1.79 m Body Mass Index: 37.57 kg/m  Temp.: 98.38F  Pulse: 117 (Regular)  BP: 128/62 (Sitting, Left Arm, Standard)       Physical Exam Stark Klein MD; 04/06/2015 6:58 PM) General Mental Status-Alert. General Appearance-Consistent with stated age. Hydration-Well hydrated. Voice-Normal.  Eye Sclera/Conjunctiva - Bilateral-No scleral icterus.  Chest and Lung Exam Chest and lung exam reveals -quiet, even and easy respiratory effort with no use of accessory muscles. Inspection Chest Wall - Normal. Back - normal.  Breast - Did not examine.  Cardiovascular Cardiovascular examination reveals -normal pedal pulses bilaterally. Note: regular rate and rhythm  Abdomen Inspection-Inspection Normal. Palpation/Percussion Palpation and Percussion of the abdomen reveal - Soft, Non Tender, No Rebound tenderness and No Rigidity (guarding).  Peripheral Vascular Upper Extremity Inspection - Bilateral - Normal - No Clubbing, No Cyanosis, No Edema, Pulses Intact. Lower Extremity Palpation - Edema - Bilateral - No edema.  Neurologic Neurologic evaluation reveals -Note: normal gross motor.  Mental Status-Normal.  Neuropsychiatric Note: difficult to understand speech. Oriented. Very simple questions answered appropriately   Musculoskeletal Global Assessment -Note: no gross deformities.  Normal Exam - Left-Upper Extremity  Strength Normal and Lower Extremity Strength Normal. Normal Exam -  Right-Upper Extremity Strength Normal and Lower Extremity Strength Normal.    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 Will definitely be more difficult since on the lesser curve and close to GE junction.  Would treat GIST since it is 4-5 cm and is at risk of bleeding.  If unable to get this out laparoscopically, would open to resect. Discussed risks with sister in law including bleeding, infection, damage to adjacent structures, leak of staple line, nausea, vomiting, difficulty swallowing in the immediate post op period. Also reviewed risk of heart or lung complications. CHOLELITHIASIS WITH CHOLEDOCHOLITHIASIS (K80.70) Impression: It sounds like the bulk of her symptoms are attributable to gallstones and biliary colic.  Reviewed that we would shoot a cholangiogram. Current Plans Schedule for Surgery Pt Education - [facility] Free Text Education/Instructions: discussed with patient and provided information.   Signed by Stark Klein, MD (04/06/2015 7:05 PM)

## 2015-04-23 ENCOUNTER — Inpatient Hospital Stay (HOSPITAL_COMMUNITY): Payer: Medicare Other | Admitting: Anesthesiology

## 2015-04-23 ENCOUNTER — Encounter (HOSPITAL_COMMUNITY): Payer: Self-pay | Admitting: Anesthesiology

## 2015-04-23 ENCOUNTER — Inpatient Hospital Stay (HOSPITAL_COMMUNITY)
Admission: RE | Admit: 2015-04-23 | Discharge: 2015-04-26 | DRG: 988 | Disposition: A | Discharge status: Home / Self Care | Payer: Medicare Other | Source: Ambulatory Visit | Attending: General Surgery | Admitting: General Surgery

## 2015-04-23 ENCOUNTER — Encounter (HOSPITAL_COMMUNITY)
Admission: RE | Disposition: A | Discharge status: Home / Self Care | Payer: Self-pay | Source: Ambulatory Visit | Attending: General Surgery

## 2015-04-23 ENCOUNTER — Inpatient Hospital Stay (HOSPITAL_COMMUNITY): Payer: Medicare Other | Admitting: Emergency Medicine

## 2015-04-23 DIAGNOSIS — K8010 Calculus of gallbladder with chronic cholecystitis without obstruction: Secondary | ICD-10-CM | POA: Diagnosis present

## 2015-04-23 DIAGNOSIS — C49A2 Gastrointestinal stromal tumor of stomach: Principal | ICD-10-CM | POA: Diagnosis present

## 2015-04-23 DIAGNOSIS — L308 Other specified dermatitis: Secondary | ICD-10-CM | POA: Diagnosis not present

## 2015-04-23 DIAGNOSIS — Q909 Down syndrome, unspecified: Secondary | ICD-10-CM | POA: Diagnosis not present

## 2015-04-23 DIAGNOSIS — E039 Hypothyroidism, unspecified: Secondary | ICD-10-CM | POA: Diagnosis present

## 2015-04-23 DIAGNOSIS — Z79899 Other long term (current) drug therapy: Secondary | ICD-10-CM

## 2015-04-23 DIAGNOSIS — K219 Gastro-esophageal reflux disease without esophagitis: Secondary | ICD-10-CM | POA: Diagnosis present

## 2015-04-23 DIAGNOSIS — K3189 Other diseases of stomach and duodenum: Secondary | ICD-10-CM | POA: Diagnosis present

## 2015-04-23 DIAGNOSIS — R109 Unspecified abdominal pain: Secondary | ICD-10-CM | POA: Diagnosis present

## 2015-04-23 HISTORY — PX: CHOLECYSTECTOMY: SHX55

## 2015-04-23 HISTORY — PX: LAPAROSCOPIC GASTRIC RESECTION: SHX1936

## 2015-04-23 HISTORY — PX: ESOPHAGOGASTRODUODENOSCOPY: SHX5428

## 2015-04-23 SURGERY — LAPAROSCOPIC CHOLECYSTECTOMY WITH INTRAOPERATIVE CHOLANGIOGRAM
Anesthesia: General | Site: Abdomen

## 2015-04-23 MED ORDER — SUGAMMADEX SODIUM 200 MG/2ML IV SOLN
INTRAVENOUS | Status: AC
  Filled 2015-04-23: qty 2

## 2015-04-23 MED ORDER — SODIUM CHLORIDE 0.9 % IR SOLN
Status: DC | PRN
  Administered 2015-04-23: 1

## 2015-04-23 MED ORDER — IOPAMIDOL (ISOVUE-300) INJECTION 61%
INTRAVENOUS | Status: AC
  Filled 2015-04-23: qty 50

## 2015-04-23 MED ORDER — CEFAZOLIN SODIUM-DEXTROSE 2-4 GM/100ML-% IV SOLN
2.0000 g | Freq: Three times a day (TID) | INTRAVENOUS | Status: AC
  Administered 2015-04-23: 2 g via INTRAVENOUS
  Filled 2015-04-23: qty 100

## 2015-04-23 MED ORDER — METRONIDAZOLE 0.75 % EX CREA
1.0000 "application " | TOPICAL_CREAM | Freq: Every day | CUTANEOUS | Status: DC | PRN
  Filled 2015-04-23: qty 45

## 2015-04-23 MED ORDER — DIPHENHYDRAMINE HCL 50 MG/ML IJ SOLN: 12.5000 mg | Freq: Four times a day (QID) | INTRAMUSCULAR | Status: DC | PRN

## 2015-04-23 MED ORDER — PHENOL 1.4 % MT LIQD
1.0000 | OROMUCOSAL | Status: DC | PRN
  Filled 2015-04-23: qty 177

## 2015-04-23 MED ORDER — HYDROMORPHONE HCL 1 MG/ML IJ SOLN: 0.5000 mg | INTRAMUSCULAR | Status: DC | PRN

## 2015-04-23 MED ORDER — LIDOCAINE HCL 1 % IJ SOLN
INTRAMUSCULAR | Status: DC | PRN
  Administered 2015-04-23: 20 mL

## 2015-04-23 MED ORDER — METRONIDAZOLE 0.75 % EX GEL
Freq: Every day | CUTANEOUS | Status: DC | PRN
  Filled 2015-04-23: qty 45

## 2015-04-23 MED ORDER — ROCURONIUM BROMIDE 50 MG/5ML IV SOLN
INTRAVENOUS | Status: AC
  Filled 2015-04-23: qty 1

## 2015-04-23 MED ORDER — PROPOFOL 10 MG/ML IV BOLUS
INTRAVENOUS | Status: DC | PRN
  Administered 2015-04-23: 160 mg via INTRAVENOUS

## 2015-04-23 MED ORDER — LACTATED RINGERS IV SOLN
INTRAVENOUS | Status: DC | PRN
  Administered 2015-04-23 (×2): via INTRAVENOUS

## 2015-04-23 MED ORDER — DEXAMETHASONE SODIUM PHOSPHATE 10 MG/ML IJ SOLN
INTRAMUSCULAR | Status: DC | PRN
  Administered 2015-04-23: 4 mg via INTRAVENOUS

## 2015-04-23 MED ORDER — EPHEDRINE SULFATE 50 MG/ML IJ SOLN
INTRAMUSCULAR | Status: DC | PRN
  Administered 2015-04-23 (×3): 5 mg via INTRAVENOUS

## 2015-04-23 MED ORDER — MORPHINE SULFATE (PF) 2 MG/ML IV SOLN
1.0000 mg | INTRAVENOUS | Status: DC | PRN
  Administered 2015-04-23 – 2015-04-24 (×5): 2 mg via INTRAVENOUS
  Filled 2015-04-23 (×5): qty 1

## 2015-04-23 MED ORDER — PROPOFOL 10 MG/ML IV BOLUS
INTRAVENOUS | Status: AC
  Filled 2015-04-23: qty 20

## 2015-04-23 MED ORDER — SUCCINYLCHOLINE CHLORIDE 200 MG/10ML IV SOSY
PREFILLED_SYRINGE | INTRAVENOUS | Status: DC | PRN
  Administered 2015-04-23: 80 mg via INTRAVENOUS

## 2015-04-23 MED ORDER — FENTANYL CITRATE (PF) 250 MCG/5ML IJ SOLN
INTRAMUSCULAR | Status: AC
  Filled 2015-04-23: qty 5

## 2015-04-23 MED ORDER — SODIUM CHLORIDE 0.9 % IV SOLN
INTRAVENOUS | Status: DC | PRN
  Administered 2015-04-23: 40 mL

## 2015-04-23 MED ORDER — DIPHENHYDRAMINE HCL 12.5 MG/5ML PO ELIX
12.5000 mg | ORAL_SOLUTION | Freq: Four times a day (QID) | ORAL | Status: DC | PRN
  Administered 2015-04-26: 12.5 mg via ORAL
  Filled 2015-04-23: qty 10

## 2015-04-23 MED ORDER — BUPIVACAINE-EPINEPHRINE (PF) 0.25% -1:200000 IJ SOLN
INTRAMUSCULAR | Status: AC
  Filled 2015-04-23: qty 30

## 2015-04-23 MED ORDER — ONDANSETRON HCL 4 MG/2ML IJ SOLN
INTRAMUSCULAR | Status: DC | PRN
  Administered 2015-04-23: 4 mg via INTRAVENOUS

## 2015-04-23 MED ORDER — ONDANSETRON HCL 4 MG/2ML IJ SOLN
INTRAMUSCULAR | Status: AC
  Filled 2015-04-23: qty 2

## 2015-04-23 MED ORDER — CIPROFLOXACIN-DEXAMETHASONE 0.3-0.1 % OT SUSP
4.0000 [drp] | Freq: Two times a day (BID) | OTIC | Status: DC
  Administered 2015-04-23 – 2015-04-26 (×7): 4 [drp] via OTIC
  Filled 2015-04-23 (×2): qty 7.5

## 2015-04-23 MED ORDER — ONDANSETRON HCL 4 MG/2ML IJ SOLN: 4.0000 mg | Freq: Once | INTRAMUSCULAR | Status: DC | PRN

## 2015-04-23 MED ORDER — KETOCONAZOLE 2 % EX CREA
1.0000 "application " | TOPICAL_CREAM | Freq: Every day | CUTANEOUS | Status: DC
  Administered 2015-04-25: 1 via TOPICAL
  Filled 2015-04-23: qty 15

## 2015-04-23 MED ORDER — BUPIVACAINE LIPOSOME 1.3 % IJ SUSP
20.0000 mL | INTRAMUSCULAR | Status: DC
  Filled 2015-04-23: qty 20

## 2015-04-23 MED ORDER — KETOROLAC TROMETHAMINE 30 MG/ML IJ SOLN
30.0000 mg | Freq: Four times a day (QID) | INTRAMUSCULAR | Status: AC
Start: 2015-04-23 — End: 2015-04-25
  Administered 2015-04-23 – 2015-04-25 (×7): 30 mg via INTRAVENOUS
  Filled 2015-04-23 (×8): qty 1

## 2015-04-23 MED ORDER — 0.9 % SODIUM CHLORIDE (POUR BTL) OPTIME
TOPICAL | Status: DC | PRN
  Administered 2015-04-23: 2000 mL

## 2015-04-23 MED ORDER — LIDOCAINE HCL (PF) 1 % IJ SOLN
INTRAMUSCULAR | Status: AC
  Filled 2015-04-23: qty 60

## 2015-04-23 MED ORDER — ONDANSETRON 4 MG PO TBDP: 4.0000 mg | ORAL_TABLET | Freq: Four times a day (QID) | ORAL | Status: DC | PRN

## 2015-04-23 MED ORDER — EVICEL 5 ML EX KIT
PACK | CUTANEOUS | Status: DC | PRN
  Administered 2015-04-23: 5 mL

## 2015-04-23 MED ORDER — LIDOCAINE HCL (CARDIAC) 20 MG/ML IV SOLN
INTRAVENOUS | Status: AC
  Filled 2015-04-23: qty 5

## 2015-04-23 MED ORDER — ONDANSETRON HCL 4 MG/2ML IJ SOLN
4.0000 mg | Freq: Four times a day (QID) | INTRAMUSCULAR | Status: DC | PRN
  Administered 2015-04-23: 4 mg via INTRAVENOUS
  Filled 2015-04-23: qty 2

## 2015-04-23 MED ORDER — KETOROLAC TROMETHAMINE 30 MG/ML IJ SOLN
30.0000 mg | Freq: Four times a day (QID) | INTRAMUSCULAR | Status: DC | PRN
  Administered 2015-04-25: 30 mg via INTRAVENOUS
  Filled 2015-04-23: qty 1

## 2015-04-23 MED ORDER — AMMONIUM LACTATE 12 % EX CREA: TOPICAL_CREAM | CUTANEOUS | Status: DC | PRN

## 2015-04-23 MED ORDER — LIDOCAINE HCL (CARDIAC) 20 MG/ML IV SOLN
INTRAVENOUS | Status: DC | PRN
  Administered 2015-04-23: 80 mg via INTRAVENOUS

## 2015-04-23 MED ORDER — KCL IN DEXTROSE-NACL 20-5-0.45 MEQ/L-%-% IV SOLN
INTRAVENOUS | Status: DC
  Administered 2015-04-23 – 2015-04-24 (×3): via INTRAVENOUS
  Administered 2015-04-25: 1 mL via INTRAVENOUS
  Administered 2015-04-25: 22:00:00 via INTRAVENOUS
  Filled 2015-04-23 (×5): qty 1000

## 2015-04-23 MED ORDER — EVICEL 5 ML EX KIT
PACK | CUTANEOUS | Status: AC
  Filled 2015-04-23: qty 1

## 2015-04-23 MED ORDER — ROCURONIUM BROMIDE 100 MG/10ML IV SOLN
INTRAVENOUS | Status: DC | PRN
  Administered 2015-04-23 (×2): 10 mg via INTRAVENOUS
  Administered 2015-04-23: 15 mg via INTRAVENOUS
  Administered 2015-04-23: 25 mg via INTRAVENOUS

## 2015-04-23 MED ORDER — PANTOPRAZOLE SODIUM 40 MG IV SOLR
40.0000 mg | Freq: Every day | INTRAVENOUS | Status: DC
  Administered 2015-04-23 – 2015-04-24 (×2): 40 mg via INTRAVENOUS
  Filled 2015-04-23 (×2): qty 40

## 2015-04-23 MED ORDER — FENTANYL CITRATE (PF) 100 MCG/2ML IJ SOLN
INTRAMUSCULAR | Status: DC | PRN
  Administered 2015-04-23 (×2): 50 ug via INTRAVENOUS
  Administered 2015-04-23: 100 ug via INTRAVENOUS
  Administered 2015-04-23 (×3): 50 ug via INTRAVENOUS

## 2015-04-23 MED ORDER — MIDAZOLAM HCL 5 MG/5ML IJ SOLN
INTRAMUSCULAR | Status: DC | PRN
  Administered 2015-04-23: 2 mg via INTRAVENOUS

## 2015-04-23 MED ORDER — SUGAMMADEX SODIUM 200 MG/2ML IV SOLN
INTRAVENOUS | Status: DC | PRN
  Administered 2015-04-23: 160 mg via INTRAVENOUS

## 2015-04-23 MED ORDER — MIDAZOLAM HCL 2 MG/2ML IJ SOLN
INTRAMUSCULAR | Status: AC
  Filled 2015-04-23: qty 2

## 2015-04-23 MED ORDER — HYDROCERIN EX CREA
TOPICAL_CREAM | CUTANEOUS | Status: DC | PRN
  Filled 2015-04-23: qty 113

## 2015-04-23 MED ORDER — EPHEDRINE SULFATE 50 MG/ML IJ SOLN
INTRAMUSCULAR | Status: AC
  Filled 2015-04-23: qty 1

## 2015-04-23 MED ORDER — SUCCINYLCHOLINE CHLORIDE 20 MG/ML IJ SOLN
INTRAMUSCULAR | Status: AC
  Filled 2015-04-23: qty 1

## 2015-04-23 SURGICAL SUPPLY — 83 items
APPLIER CLIP ROT 10 11.4 M/L (STAPLE) ×3
BLADE SURG ROTATE 9660 (MISCELLANEOUS) IMPLANT
CANISTER SUCTION 2500CC (MISCELLANEOUS) ×3 IMPLANT
CHLORAPREP W/TINT 26ML (MISCELLANEOUS) ×3 IMPLANT
CLIP APPLIE ROT 10 11.4 M/L (STAPLE) ×1 IMPLANT
COVER MAYO STAND STRL (DRAPES) IMPLANT
COVER SURGICAL LIGHT HANDLE (MISCELLANEOUS) ×3 IMPLANT
DERMABOND ADVANCED (GAUZE/BANDAGES/DRESSINGS) ×2
DERMABOND ADVANCED .7 DNX12 (GAUZE/BANDAGES/DRESSINGS) ×1 IMPLANT
DEVICE TROCAR PUNCTURE CLOSURE (ENDOMECHANICALS) IMPLANT
DRAIN CHANNEL 19F RND (DRAIN) IMPLANT
DRAIN PENROSE 1/2X36 STERILE (WOUND CARE) IMPLANT
DRAPE C-ARM 42X72 X-RAY (DRAPES) IMPLANT
DRAPE LAPAROSCOPIC ABDOMINAL (DRAPES) ×3 IMPLANT
DRAPE UTILITY XL STRL (DRAPES) IMPLANT
DRAPE WARM FLUID 44X44 (DRAPE) ×3 IMPLANT
DRSG COVADERM 4X10 (GAUZE/BANDAGES/DRESSINGS) IMPLANT
DRSG COVADERM 4X14 (GAUZE/BANDAGES/DRESSINGS) IMPLANT
ELECT BLADE 6.5 EXT (BLADE) IMPLANT
ELECT CAUTERY BLADE 6.4 (BLADE) IMPLANT
ELECT REM PT RETURN 9FT ADLT (ELECTROSURGICAL) ×3
ELECTRODE REM PT RTRN 9FT ADLT (ELECTROSURGICAL) ×1 IMPLANT
EVACUATOR SILICONE 100CC (DRAIN) IMPLANT
FILTER SMOKE EVAC LAPAROSHD (FILTER) IMPLANT
GAUZE SPONGE 4X4 12PLY STRL (GAUZE/BANDAGES/DRESSINGS) IMPLANT
GLOVE BIO SURGEON STRL SZ 6 (GLOVE) ×3 IMPLANT
GLOVE BIOGEL PI IND STRL 6.5 (GLOVE) ×1 IMPLANT
GLOVE BIOGEL PI INDICATOR 6.5 (GLOVE) ×2
GOWN STRL REUS W/ TWL LRG LVL3 (GOWN DISPOSABLE) ×3 IMPLANT
GOWN STRL REUS W/TWL 2XL LVL3 (GOWN DISPOSABLE) ×6 IMPLANT
GOWN STRL REUS W/TWL LRG LVL3 (GOWN DISPOSABLE) ×6
KIT BASIN OR (CUSTOM PROCEDURE TRAY) ×3 IMPLANT
KIT ROOM TURNOVER OR (KITS) ×3 IMPLANT
L-HOOK LAP DISP 36CM (ELECTROSURGICAL) ×6
LHOOK LAP DISP 36CM (ELECTROSURGICAL) ×2 IMPLANT
LIQUID BAND (GAUZE/BANDAGES/DRESSINGS) ×3 IMPLANT
LOOP VESSEL MAXI BLUE (MISCELLANEOUS) IMPLANT
NS IRRIG 1000ML POUR BTL (IV SOLUTION) ×3 IMPLANT
PAD ARMBOARD 7.5X6 YLW CONV (MISCELLANEOUS) ×6 IMPLANT
PENCIL BUTTON HOLSTER BLD 10FT (ELECTRODE) ×3 IMPLANT
POUCH SPECIMEN RETRIEVAL 10MM (ENDOMECHANICALS) ×9 IMPLANT
RELOAD BLUE (STAPLE) ×15 IMPLANT
RELOAD WHITE ECR60W (STAPLE) IMPLANT
RETRACTOR WND ALEXIS 25 LRG (MISCELLANEOUS) IMPLANT
RTRCTR WOUND ALEXIS 25CM LRG (MISCELLANEOUS)
SCALPEL HARMONIC ACE (MISCELLANEOUS) ×3 IMPLANT
SCISSORS LAP 5X35 DISP (ENDOMECHANICALS) ×3 IMPLANT
SEALANT SURGICAL APPL DUAL CAN (MISCELLANEOUS) IMPLANT
SET CHOLANGIOGRAPH 5 50 .035 (SET/KITS/TRAYS/PACK) IMPLANT
SET IRRIG TUBING LAPAROSCOPIC (IRRIGATION / IRRIGATOR) ×3 IMPLANT
SLEEVE ENDOPATH XCEL 5M (ENDOMECHANICALS) ×12 IMPLANT
SPECIMEN JAR SMALL (MISCELLANEOUS) ×3 IMPLANT
STAPLE ECHEON FLEX 60 POW ENDO (STAPLE) ×3 IMPLANT
STAPLER VISISTAT 35W (STAPLE) IMPLANT
SUT ETHILON 2 0 FS 18 (SUTURE) IMPLANT
SUT MNCRL AB 4-0 PS2 18 (SUTURE) ×6 IMPLANT
SUT PDS AB 1 TP1 96 (SUTURE) IMPLANT
SUT PDS II 0 TP-1 LOOPED 60 (SUTURE) IMPLANT
SUT SILK 2 0 SH CR/8 (SUTURE) IMPLANT
SUT SILK 2 0 TIES 10X30 (SUTURE) IMPLANT
SUT SILK 3 0 SH CR/8 (SUTURE) IMPLANT
SUT SILK 3 0 TIES 10X30 (SUTURE) IMPLANT
SUT VICRYL 0 UR6 27IN ABS (SUTURE) ×3 IMPLANT
SYS LAPSCP GELPORT 120MM (MISCELLANEOUS)
SYSTEM LAPSCP GELPORT 120MM (MISCELLANEOUS) IMPLANT
TIP INNERVISION DETACH 40FR (MISCELLANEOUS) IMPLANT
TIP INNERVISION DETACH 50FR (MISCELLANEOUS) ×3 IMPLANT
TIP INNERVISION DETACH 56FR (MISCELLANEOUS) IMPLANT
TIP RIGID 35CM EVICEL (HEMOSTASIS) ×3 IMPLANT
TIPS INNERVISION DETACH 40FR (MISCELLANEOUS)
TOWEL OR 17X24 6PK STRL BLUE (TOWEL DISPOSABLE) ×3 IMPLANT
TOWEL OR 17X26 10 PK STRL BLUE (TOWEL DISPOSABLE) ×3 IMPLANT
TRAY FOLEY CATH 14FRSI W/METER (CATHETERS) IMPLANT
TRAY LAPAROSCOPIC MC (CUSTOM PROCEDURE TRAY) ×3 IMPLANT
TROCAR XCEL 12X100 BLDLESS (ENDOMECHANICALS) IMPLANT
TROCAR XCEL BLUNT TIP 100MML (ENDOMECHANICALS) ×3 IMPLANT
TROCAR XCEL NON-BLD 11X100MML (ENDOMECHANICALS) ×3 IMPLANT
TROCAR XCEL NON-BLD 5MMX100MML (ENDOMECHANICALS) ×3 IMPLANT
TUBE CONNECTING 12'X1/4 (SUCTIONS)
TUBE CONNECTING 12X1/4 (SUCTIONS) IMPLANT
TUBING FILTER THERMOFLATOR (ELECTROSURGICAL) IMPLANT
TUBING INSUFFLATION (TUBING) ×3 IMPLANT
YANKAUER SUCT BULB TIP NO VENT (SUCTIONS) IMPLANT

## 2015-04-23 NOTE — Op Note (Signed)
PRE-OPERATIVE DIAGNOSIS: Mass proximal stomach, cholelithiasis and history of choledocholithiasis, s/p ERCP  POST-OPERATIVE DIAGNOSIS:  Same  PROCEDURE:  Procedure(s): Laparoscopic cholecystectomy and laparoscopic partial gastrectomy  SURGEON:  Surgeon(s): Stark Klein, MD  ASSISTANT:   Leighton Ruff, MD  ANESTHESIA:   local and general  DRAINS: none   LOCAL MEDICATIONS USED:  BUPIVICAINE  and LIDOCAINE, Exparel  SPECIMEN:  Source of Specimen:  stomach mass, gallbladder  DISPOSITION OF SPECIMEN:  PATHOLOGY  COUNTS:  YES  PLAN OF CARE: Admit to inpatient   PATIENT DISPOSITION:  PACU - hemodynamically stable.   FINDINGS:  Rubbery mass in proximal stomach approximately 3.5 cm.  PROCEDURE:  Pt was identified in the holding area and taken to the operating room and placed supine on the operating room table.  General anesthesia was induced.  A foley catheter was placed.  The abdomen was prepped and draped in sterile fashion.  Timeout was performed according to the surgical safety checklist.  When all was correct, we continued.    Local anesthetic agent was injected into the skin near the umbilicus and an incision made. We dissected down to the abdominal fascia with blunt dissection.  The fascia was incised vertically and we entered the peritoneal cavity bluntly.  A pursestring suture of 0-Vicryl was placed around the fascial opening.  The Hasson cannula was inserted and secured with the stay suture.  Pneumoperitoneum was then created with CO2 and tolerated well without any adverse changes in the patient's vital signs. An 11-mm port was placed in the subxiphoid position.  Two 5-mm ports were placed in the right upper quadrant. All skin incisions were infiltrated with a local anesthetic agent before making the incision and placing the trocars.   We positioned the patient in reverse Trendelenburg, tilted slightly to the patient's left.  The gallbladder was identified, the fundus grasped  and retracted cephalad. Adhesions were lysed bluntly and with the electrocautery where indicated, taking care not to injure any adjacent organs or viscus. The infundibulum was grasped and retracted laterally, exposing the peritoneum overlying the triangle of Calot. This was then divided and exposed in a blunt fashion. A critical view of the cystic duct and cystic artery was obtained.  The cystic duct was clearly identified and bluntly dissected circumferentially. The cystic duct was ligated with a clip distally.  The cystic duct was then ligated with clips and divided. The cystic artery was identified, dissected free, ligated with clips and divided as well.   The gallbladder was dissected from the liver bed in retrograde fashion with the electrocautery. The gallbladder was removed and placed in an Endocatch bag.  The gallbladder and Endocatch bag were then removed through the umbilical port site.  The liver bed was irrigated and inspected. Hemostasis was achieved with the electrocautery. Copious irrigation was utilized and was repeatedly aspirated until clear.    We again inspected the right upper quadrant for hemostasis.  The patient was placed in reverse Trendelenberg position and rotated to the left.  Two additional 5 mm trocars were placed in  the left upper quadrant.  The stomach was mobilized with the harmonic on the lesser curve at the gastrohepatic ligament and at the cardia.  The GE junction was exposed.  The mass was visible. The articulating liver retractor was placed in the epigastric port to hold the left lateral segment up. The 50 Fr lighted Bougie retractor was placed by anesthesia.  There was adequate diameter of the esophagus to staple off the mass.  Three  fires of the Echelon stapler were used to divide the mass of the anterior proximal stomach with the mass from the stomach/GE junction.  There was no bleeding at the staple line.  The mass was retrieved with an endocatch bag via the 12 mm  port.  This was examined to evaluate the staple line.  The mass was very close to the staple line, but to take more would require resection the proximal stomach and reconnecting.    The left upper quadrant was reexamined for hemostasis.  There was no evidence of bleeding.  A 4 quadrant inspection was performed and was negative for signs of succus or blood.  Evicel was placed over the staple line.   The ports were removed and pneumoperitoneum was allowed to evacuate. The umbilical pursestring was closed and there was no residual fascial defect. The skin of all the incisions was closed with 4-0 Monocryl in subcuticular fashion.  The wounds were cleaned, dried, and dressed with dermabond. Needle, sponge, and instrument counts were correct.  The patient was awakened from anesthesia and taken to the PACU in stable condition.

## 2015-04-23 NOTE — Discharge Instructions (Addendum)
[facility], [place] [phone]  ABDOMINAL SURGERY: POST OP INSTRUCTIONS  Always review your discharge instruction sheet given to you by the facility where your surgery was performed.  IF YOU HAVE DISABILITY OR FAMILY LEAVE FORMS, YOU MUST BRING THEM TO THE OFFICE FOR PROCESSING.  PLEASE DO NOT GIVE THEM TO YOUR DOCTOR.  1. A prescription for pain medication may be given to you upon discharge.  Take your pain medication as prescribed, if needed.  If narcotic pain medicine is not needed, then you may take acetaminophen (Tylenol) or ibuprofen (Advil) as needed. 2. Take your usually prescribed medications unless otherwise directed. 3. If you need a refill on your pain medication, please contact your pharmacy. They will contact our office to request authorization.  Prescriptions will not be filled after 5pm or on week-ends. 4. You should follow a soft and/or liquid diet for around 1 week after arrival home, such as soup and crackers, pudding, smoothies, etc.unless your doctor has advised otherwise. A high-fiber, low fat diet can be resumed as tolerated.   Be sure to include lots of fluids daily. Most patients will experience some swelling and bruising on the chest and neck area.  Ice packs will help.  Swelling and bruising can take several days to resolve 5. Most patients will experience some swelling and bruising in the area of the incision. Ice pack will help. Swelling and bruising can take several days to resolve..  6. It is common to experience some constipation if taking pain medication after surgery.  Increasing fluid intake and taking a stool softener will usually help or prevent this problem from occurring.  A mild laxative (Milk of Magnesia or Miralax) should be taken according to package directions if there are no bowel movements after 48 hours. 7.  You may have steri-strips (small skin tapes) in place directly over the incision.  These strips should be left on the skin for  10-14 days.  If your surgeon used skin glue on the incision, you may shower in 48 hours.  The glue will flake off over the next 2-3 weeks.  Any sutures or staples will be removed at the office during your follow-up visit. You may find that a light gauze bandage over your incision may keep your staples from being rubbed or pulled. You may shower and replace the bandage daily. 8. ACTIVITIES:  You may resume regular (light) daily activities beginning the next day--such as daily self-care, walking, climbing stairs--gradually increasing activities as tolerated.  You may have sexual intercourse when it is comfortable.  Refrain from any heavy lifting or straining until approved by your doctor. a. You may drive when you no longer are taking prescription pain medication, you can comfortably wear a seatbelt, and you can safely maneuver your car and apply brakes b. Return to Work: __________4 weeks if applicable_________________________ 9. You should see your doctor in the office for a follow-up appointment approximately two weeks after your surgery.  Make sure that you call for this appointment within a day or two after you arrive home to insure a convenient appointment time. OTHER INSTRUCTIONS:  _____________________________________________________________ _____________________________________________________________  WHEN TO CALL YOUR DOCTOR: 1. Fever over 101.0 2. Inability to urinate 3. Nausea and/or vomiting 4. Extreme swelling or bruising 5. Continued bleeding from incision. 6. Increased pain, redness, or drainage from the incision. 7. Difficulty swallowing or breathing 8. Muscle cramping or spasms. 9. Numbness or tingling in hands or feet or around lips.  The  clinic staff is available to answer your questions during regular business hours.  Please dont hesitate to call and ask to speak to one of the nurses if you have concerns.  For further questions, please visit  [email or web address]

## 2015-04-23 NOTE — Transfer of Care (Signed)
Immediate Anesthesia Transfer of Care Note  Patient: Sara Mcgee  Procedure(s) Performed: Procedure(s): LAPAROSCOPIC CHOLECYSTECTOMY WITH INTRAOPERATIVE CHOLANGIOGRAM (N/A) LAPAROSCOPIC PARTIAL GASTRECTOMY (N/A) ESOPHAGOGASTRODUODENOSCOPY (EGD) (N/A)  Patient Location: PACU  Anesthesia Type:General  Level of Consciousness: sedated  Airway & Oxygen Therapy: Patient Spontanous Breathing and Patient connected to nasal cannula oxygen  Post-op Assessment: Report given to RN and Post -op Vital signs reviewed and stable  Post vital signs: stable  Last Vitals:  Filed Vitals:   04/23/15 0555  BP: 104/58  Pulse: 83  Temp: 36.7 C  Resp: 20    Complications: No apparent anesthesia complications

## 2015-04-23 NOTE — Anesthesia Procedure Notes (Signed)
Procedure Name: Intubation Date/Time: 04/23/2015 7:50 AM Performed by: Ignacia Bayley Pre-anesthesia Checklist: Patient identified, Emergency Drugs available, Suction available and Patient being monitored Patient Re-evaluated:Patient Re-evaluated prior to inductionOxygen Delivery Method: Circle system utilized Preoxygenation: Pre-oxygenation with 100% oxygen Intubation Type: IV induction Ventilation: Mask ventilation without difficulty Laryngoscope Size: Mac and 3 Grade View: Grade I Tube type: Oral Tube size: 7.0 mm Number of attempts: 1 Airway Equipment and Method: Stylet Placement Confirmation: breath sounds checked- equal and bilateral,  ETT inserted through vocal cords under direct vision and positive ETCO2 Secured at: 20 cm Tube secured with: Tape Dental Injury: Teeth and Oropharynx as per pre-operative assessment

## 2015-04-23 NOTE — Interval H&P Note (Signed)
History and Physical Interval Note:  04/23/2015 7:29 AM  Sara Mcgee  has presented today for surgery, with the diagnosis of Biliary colic and GIST  The various methods of treatment have been discussed with the patient and family. After consideration of risks, benefits and other options for treatment, the patient has consented to  Procedure(s): LAPAROSCOPIC CHOLECYSTECTOMY WITH INTRAOPERATIVE CHOLANGIOGRAM (N/A) LAPAROSCOPIC PARTIAL GASTRECTOMY (N/A) ESOPHAGOGASTRODUODENOSCOPY (EGD) (N/A) as a surgical intervention .  The patient's history has been reviewed, patient examined, no change in status, stable for surgery.  I have reviewed the patient's chart and labs.  Questions were answered to the patient's satisfaction.     [name]

## 2015-04-23 NOTE — Anesthesia Postprocedure Evaluation (Signed)
Anesthesia Post Note  Patient: Liadan B Dubas  Procedure(s) Performed: Procedure(s) (LRB): LAPAROSCOPIC CHOLECYSTECTOMY WITH INTRAOPERATIVE CHOLANGIOGRAM (N/A) LAPAROSCOPIC PARTIAL GASTRECTOMY (N/A) ESOPHAGOGASTRODUODENOSCOPY (EGD) (N/A)  Patient location during evaluation: PACU Anesthesia Type: General Level of consciousness: awake, awake and alert, oriented and patient cooperative Pain management: pain level controlled Vital Signs Assessment: post-procedure vital signs reviewed and stable Respiratory status: spontaneous breathing and respiratory function stable Cardiovascular status: blood pressure returned to baseline Anesthetic complications: no    Last Vitals:  Filed Vitals:   04/23/15 1045 04/23/15 1100  BP: 144/47 146/67  Pulse: 103 95  Temp:    Resp: 19 11    Last Pain:  Filed Vitals:   04/23/15 1110  PainSc: 0-No pain                 Omaria Plunk EDWARD

## 2015-04-23 NOTE — Anesthesia Preprocedure Evaluation (Signed)
Anesthesia Evaluation  Patient identified by MRN, date of birth, ID band Patient awake    Reviewed: Allergy & Precautions, NPO status , Patient's Chart, lab work & pertinent test results  History of Anesthesia Complications (+) PONV  Airway Mallampati: II  TM Distance: >3 FB     Dental   Pulmonary    Pulmonary exam normal        Cardiovascular Normal cardiovascular exam     Neuro/Psych  Neuromuscular disease    GI/Hepatic GERD  ,  Endo/Other  Hypothyroidism   Renal/GU      Musculoskeletal   Abdominal   Peds  Hematology   Anesthesia Other Findings   Reproductive/Obstetrics                             Anesthesia Physical Anesthesia Plan  ASA: II  Anesthesia Plan: General   Post-op Pain Management:    Induction: Intravenous  Airway Management Planned: Oral ETT  Additional Equipment:   Intra-op Plan:   Post-operative Plan: Extubation in OR  Informed Consent: I have reviewed the patients History and Physical, chart, labs and discussed the procedure including the risks, benefits and alternatives for the proposed anesthesia with the patient or authorized representative who has indicated his/her understanding and acceptance.     Plan Discussed with: Anesthesiologist, CRNA and Surgeon  Anesthesia Plan Comments:         Anesthesia Quick Evaluation

## 2015-04-24 MED ORDER — ACETAMINOPHEN 160 MG/5ML PO SOLN
650.0000 mg | Freq: Four times a day (QID) | ORAL | Status: DC
  Administered 2015-04-24 – 2015-04-26 (×7): 650 mg via ORAL
  Filled 2015-04-24 (×7): qty 20.3

## 2015-04-24 MED ORDER — OXYCODONE HCL 5 MG/5ML PO SOLN
5.0000 mg | ORAL | Status: DC | PRN
  Administered 2015-04-25 (×2): 5 mg via ORAL
  Filled 2015-04-24 (×2): qty 5

## 2015-04-24 MED ORDER — ENOXAPARIN SODIUM 40 MG/0.4ML ~~LOC~~ SOLN
40.0000 mg | SUBCUTANEOUS | Status: DC
  Administered 2015-04-24 – 2015-04-25 (×2): 40 mg via SUBCUTANEOUS
  Filled 2015-04-24 (×2): qty 0.4

## 2015-04-24 NOTE — Progress Notes (Signed)
1 Day Post-Op  Subjective: Pt doing well.  Not too sore.  No n/v.  Did not pull out NGT  Objective: Vital signs in last 24 hours: Temp:  [97.6 F (36.4 C)-98.9 F (37.2 C)] 98.9 F (37.2 C) (04/14 0555) Pulse Rate:  [84-103] 84 (04/14 0555) Resp:  [6-19] 17 (04/14 0555) BP: (91-151)/(43-76) 116/56 mmHg (04/14 0555) SpO2:  [91 %-98 %] 93 % (04/14 0555) Last BM Date: 04/22/15  Intake/Output from previous day: 04/13 0701 - 04/14 0700 In: 2960 [I.V.:2860; IV Piggyback:100] Out: 525 [Urine:175; Emesis/NG output:350] Intake/Output this shift: Total I/O In: -  Out: 250 [Urine:250]  General appearance: alert, cooperative and no distress Resp: breathing comfortably GI: soft, approp tender, non distended.  bruising at umbilical incision.    Lab Results:   Recent Labs  04/21/15 1139  WBC 5.6  HCT 45.1  PLT 290   BMET No results for input(s): NA, K, CL, CO2, GLUCOSE, BUN, CREATININE, CALCIUM in the last 72 hours. PT/INR No results for input(s): LABPROT, INR in the last 72 hours. ABG No results for input(s): PHART, HCO3 in the last 72 hours.  Invalid input(s): PCO2, PO2  Studies/Results: No results found.  Anti-infectives: Anti-infectives    Start     Dose/Rate Route Frequency Ordered Stop   04/23/15 1600  ceFAZolin (ANCEF) IVPB 2g/100 mL premix     2 g 200 mL/hr over 30 Minutes Intravenous 3 times per day 04/23/15 1244 04/23/15 1710   04/23/15 0700  ceFAZolin (ANCEF) IVPB 2g/100 mL premix     2 g 200 mL/hr over 30 Minutes Intravenous To [facility] 04/22/15 0946 04/23/15 0809      Assessment/Plan: s/p Procedure(s): LAPAROSCOPIC CHOLECYSTECTOMY WITH INTRAOPERATIVE CHOLANGIOGRAM (N/A) LAPAROSCOPIC PARTIAL GASTRECTOMY (N/A) ESOPHAGOGASTRODUODENOSCOPY (EGD) (N/A) d/c NGT  Full liquids Decrease IVF Oral liquid pain meds.     LOS: 1 day    [facility] 04/24/2015

## 2015-04-24 NOTE — Progress Notes (Signed)
ANTICOAGULATION CONSULT NOTE - Initial Consult  Pharmacy Consult for Enoxaparin Indication: VTE prophylaxis  No Known Allergies Patient Measurements: Weight: 185 lb (83.915 kg) Vital Signs: Temp: 98.9 F (37.2 C) (04/14 0555) Temp Source: Oral (04/14 0555) BP: 116/56 mmHg (04/14 0555) Pulse Rate: 84 (04/14 0555) Labs: No results for input(s): HGB, HCT, PLT, APTT, LABPROT, INR, HEPARINUNFRC, HEPRLOWMOCWT, CREATININE, CKTOTAL, CKMB, TROPONINI in the last 72 hours. Estimated Creatinine Clearance: 73.6 mL/min (by C-G formula based on Cr of 0.94).  Medical History: Past Medical History  Diagnosis Date  . Down's syndrome   . Thyroid disease     hyperthyroid  . Bilateral external ear infections   . Dry skin     feet  . Dyslipidemia   . Gallstones   . Esophageal dysmotility   . GERD (gastroesophageal reflux disease)   . Blisters of multiple sites     on lips since scan  . PONV (postoperative nausea and vomiting)   . Hard of hearing     Medications:  Scheduled:  . acetaminophen (TYLENOL) oral liquid 160 mg/5 mL  650 mg Oral Q6H  . ciprofloxacin-dexamethasone  4 drop Left Ear BID  . ketoconazole  1 application Topical Daily  . ketorolac  30 mg Intravenous 4 times per day  . pantoprazole (PROTONIX) IV  40 mg Intravenous QHS   Infusions:  . dextrose 5 % and 0.45 % NaCl with KCl 20 mEq/L 50 mL/hr at 04/24/15 1255    Assessment: 35 year old female s/p lap chole with partial gastrectomy on 4/13 now pharmacy consulted to dose Lovenox for VTE prophylaxis. Will dose according to lower risk guideline dosing as this was not a trauma related surgery. CBC is within normal limits. CrCl ~70-92mL/min.   Goal of Therapy:  Monitor platelets by anticoagulation protocol: Yes   Plan:  Lovenox 40mg  SQ q24h for VTE prophylaxis- start 24 hours out from surgery.  Pharmacy will sign off.    Sloan Leiter, PharmD, BCPS Clinical Pharmacist [phone]  04/24/2015,1:18 PM

## 2015-04-25 MED ORDER — PANTOPRAZOLE SODIUM 40 MG PO TBEC
40.0000 mg | DELAYED_RELEASE_TABLET | Freq: Every day | ORAL | Status: DC
  Administered 2015-04-25: 40 mg via ORAL
  Filled 2015-04-25: qty 1

## 2015-04-25 NOTE — Progress Notes (Signed)
2 Days Post-Op  Subjective: Stable and alert NG tube out and tolerating full liquid diet Passing flatus but no stool Says she is very hungry Ambulating a lot Spoke with family  Objective: Vital signs in last 24 hours: Temp:  [98.3 F (36.8 C)-98.5 F (36.9 C)] 98.5 F (36.9 C) (04/15 0412) Pulse Rate:  [70-84] 70 (04/15 0412) Resp:  [16-18] 18 (04/15 0412) BP: (97-130)/(49-53) 97/53 mmHg (04/15 0412) SpO2:  [96 %-98 %] 98 % (04/15 0412) Last BM Date: 04/24/15  Intake/Output from previous day: 04/14 0701 - 04/15 0700 In: 2600.8 [P.O.:1200; I.V.:1370.8; NG/GT:30] Out: 750 [Urine:700; Emesis/NG output:50] Intake/Output this shift: Total I/O In: -  Out: 500 [Urine:500]  General appearance: Sitting in chair eating her full liquid breakfast.  Seems happy and in no distress. GI: soft, non-tender; bowel sounds normal; no masses,  no organomegaly  Lab Results:  No results found for this or any previous visit (from the past 24 hour(s)).   Studies/Results: No results found.  Marland Kitchen acetaminophen (TYLENOL) oral liquid 160 mg/5 mL  650 mg Oral Q6H  . ciprofloxacin-dexamethasone  4 drop Left Ear BID  . enoxaparin (LOVENOX) injection  40 mg Subcutaneous Q24H  . ketoconazole  1 application Topical Daily  . ketorolac  30 mg Intravenous 4 times per day  . pantoprazole (PROTONIX) IV  40 mg Intravenous QHS     Assessment/Plan: s/p Procedure(s): LAPAROSCOPIC CHOLECYSTECTOMY WITH INTRAOPERATIVE CHOLANGIOGRAM LAPAROSCOPIC PARTIAL GASTRECTOMY ESOPHAGOGASTRODUODENOSCOPY (EGD) @PROBHOSP @  POD #2.  Wedge proximal gastrectomy an laparoscopic cholecystectomy. Doing well Soft diet Ambulate a lot Possible discharge tomorrow  Down's syndrome Hypothyroidism     LOS: 2 days    Sara Mcgee M 04/25/2015  . .prob

## 2015-04-26 MED ORDER — FLUCONAZOLE 100 MG PO TABS
100.0000 mg | ORAL_TABLET | Freq: Every day | ORAL | Status: DC
  Administered 2015-04-26: 100 mg via ORAL
  Filled 2015-04-26: qty 1

## 2015-04-26 MED ORDER — FLUCONAZOLE 100 MG PO TABS: 100.0000 mg | ORAL_TABLET | Freq: Every day | ORAL | Status: DC

## 2015-04-26 MED ORDER — OXYCODONE HCL 5 MG/5ML PO SOLN: 5.0000 mg | ORAL | Status: DC | PRN

## 2015-04-26 NOTE — Discharge Summary (Signed)
Patient ID: Sara Mcgee [id number] 35 y.o. 1980-10-27  Admit date: 04/23/2015  Discharge date and time: 04/26/2015  Admitting Physician: Stark Klein  Discharge Physician: Adin Hector  Admission Diagnoses: Biliary colic and GIST  Discharge Diagnoses: Gastrointestinal stromal tumor of proximal stomach                                         Chronic cholecystitis with cholelithiasis                                         Trisomy 21                                           Skin rash.  Postoperative.  Suspect mild fungal dermatitis  Operations: Procedure(s): LAPAROSCOPIC CHOLECYSTECTOMY WITH INTRAOPERATIVE CHOLANGIOGRAM LAPAROSCOPIC PARTIAL GASTRECTOMY ESOPHAGOGASTRODUODENOSCOPY (EGD)  Admission Condition: good  Discharged Condition: good  Indication for Admission: This is a 35 year old Caucasian female with Down syndrome.  She's had some abdominal symptoms.  She was found to have gallstones.  Upper endoscopy showed what looked like a GIS tumor of the proximal stomach.  She was evaluated by Dr. Ardis Hughs and Dr. Barry Dienes.  Decision was made to bring to the hospital electively for resection of her gastric tumor and cholecystectomy.  Hospital Course: On the day of admission the patient was taken the operating room and underwent laparoscopic cholecystectomy and laparoscopic assisted resection of her proximal gastric mass.  Postoperatively she did well.  Nasogastric tube was removed on postop day 1.  Liquid diet was started and advanced without difficulty to solid diet.  She became ambulatory.  She had no trouble voiding.  She was having bowel movements.         On the day of discharge she was noted to have a mild papular rash of her torso.  Not much erythema.  She had had fungal infections before.  We decided to treat this with Dr. Truman Hayward can for 5 days and Benadryl cream.  Her abdomen was soft and nontender.  All the wounds look good.  Her family was present.     She was given a  prescription for oxycodone solution and Diflucan.  She was asked to return to see Dr. Barry Dienes in 2 weeks.  There were told we would call the pathology report to him.  Diet and activities were discussed.   Consults: None  Significant Diagnostic Studies: Surgical pathology, pending  Treatments: surgery: Laparoscopic cholecystectomy and wedge resection of proximal gastric mass  Disposition: Home  Patient Instructions:    Medication List    TAKE these medications        ammonium lactate 12 % cream  Commonly known as:  AMLACTIN  APPLY TOPICALLY ONCE A DAY AS NEEDED FOR DRY SKIN, APPLY TO FEET DAILY AT BEDTIME AS INSTRUCTED     ciprofloxacin-dexamethasone otic suspension  Commonly known as:  [place] 4 drops into the left ear 2 (two) times daily.     fluconazole 100 MG tablet  Commonly known as:  DIFLUCAN  Take 1 tablet (100 mg total) by mouth daily.     ketoconazole 2 % cream  Commonly known as:  NIZORAL  Apply 1  application topically daily.     levothyroxine 88 MCG tablet  Commonly known as:  SYNTHROID, LEVOTHROID  TAKE ONE TABLET BY MOUTH ONE TIME DAILY     loperamide 2 MG capsule  Commonly known as:  IMODIUM  Take 1 capsule (2 mg total) by mouth daily.     metroNIDAZOLE 0.75 % cream  Commonly known as:  METROCREAM  Apply 1 application topically daily as needed (rosacea). Reported on 01/28/2015     omeprazole 40 MG capsule  Commonly known as:  PRILOSEC  Take 1 capsule (40 mg total) by mouth daily.     oxyCODONE 5 MG/5ML solution  Commonly known as:  ROXICODONE  Take 5 mLs (5 mg total) by mouth every 4 (four) hours as needed for moderate pain.        Activity: activity as tolerated Diet: low fat, low cholesterol diet Wound Care: none needed  Follow-up:  With Dr. Barry Dienes  in 2 weeks.  Signed: Edsel Petrin. Dalbert Batman, M.D., FACS General and minimally invasive surgery Breast and Colorectal Surgery  04/26/2015, 9:40 AM

## 2015-04-26 NOTE — Progress Notes (Signed)
Discussed discharge summary with patient and patients caregiver. Reviewed all medications with patient. Patient received Rx. Patient ready for discharge.

## 2015-04-27 NOTE — Progress Notes (Signed)
Quick Note:  Please let patient's sister know this is the GI stromal tumor (GIST) like we talked about. We will go over it more at post op appt. ______

## 2015-04-28 ENCOUNTER — Encounter (HOSPITAL_COMMUNITY): Payer: Self-pay | Admitting: General Surgery

## 2015-05-21 ENCOUNTER — Ambulatory Visit: Payer: Medicare Other | Admitting: Family

## 2015-06-02 ENCOUNTER — Other Ambulatory Visit: Payer: Self-pay | Admitting: Nurse Practitioner

## 2015-06-02 ENCOUNTER — Other Ambulatory Visit: Payer: Self-pay | Admitting: Internal Medicine

## 2015-08-06 ENCOUNTER — Other Ambulatory Visit: Payer: Self-pay | Admitting: Internal Medicine

## 2015-09-30 ENCOUNTER — Other Ambulatory Visit: Payer: Self-pay | Admitting: Internal Medicine

## 2015-10-07 ENCOUNTER — Telehealth: Payer: Self-pay | Admitting: Nurse Practitioner

## 2015-10-07 NOTE — Telephone Encounter (Signed)
Apt scheduled Pt's caregiver  notified

## 2015-10-08 ENCOUNTER — Ambulatory Visit (INDEPENDENT_AMBULATORY_CARE_PROVIDER_SITE_OTHER): Payer: Medicare Other | Admitting: Family Medicine

## 2015-10-08 ENCOUNTER — Encounter: Payer: Self-pay | Admitting: Family Medicine

## 2015-10-08 VITALS — BP 105/69 | HR 76 | Temp 98.5°F | Ht <= 58 in | Wt 178.5 lb

## 2015-10-08 DIAGNOSIS — Z23 Encounter for immunization: Secondary | ICD-10-CM | POA: Diagnosis not present

## 2015-10-08 DIAGNOSIS — M67431 Ganglion, right wrist: Secondary | ICD-10-CM | POA: Diagnosis not present

## 2015-10-08 DIAGNOSIS — M67432 Ganglion, left wrist: Secondary | ICD-10-CM | POA: Diagnosis not present

## 2015-10-08 NOTE — Progress Notes (Signed)
BP 105/69   Pulse 76   Temp 98.5 F (36.9 C) (Oral)   Ht 4\' 8"  (1.422 m)   Wt 178 lb 8 oz (81 kg)   BMI 40.02 kg/m    Subjective:    Patient ID: Sara Mcgee, female    DOB: 12-07-1980, 35 y.o.   MRN: [id number]  HPI: Sara Mcgee is a 35 y.o. female presenting on 10/08/2015 for Knots on both hands   HPI Knots on the wrists Patient is coming in today with complaints of knots on the dorsal side of both wrists and wanted to know what they were. The one on her right wrist had been increasing in size but then over the past month has decreased in size on the left wrist just popped up a few weeks ago. She denies any fevers or chills or redness or warmth. Both of the knots are nontender. They're also not big enough to inhibit wrist range of motion.  Relevant past medical, surgical, family and social history reviewed and updated as indicated. Interim medical history since our last visit reviewed. Allergies and medications reviewed and updated.  Review of Systems  Constitutional: Negative for chills and fever.  Eyes: Negative for redness and visual disturbance.  Respiratory: Negative for chest tightness and shortness of breath.   Cardiovascular: Negative for chest pain and leg swelling.  Genitourinary: Negative for difficulty urinating and dysuria.  Musculoskeletal: Positive for joint swelling. Negative for back pain and gait problem.  Skin: Negative for rash.  Neurological: Negative for light-headedness and headaches.  Psychiatric/Behavioral: Negative for agitation and behavioral problems.  All other systems reviewed and are negative.   Per HPI unless specifically indicated above     Medication List       Accurate as of 10/08/15 10:43 AM. Always use your most recent med list.          ammonium lactate 12 % cream Commonly known as:  AMLACTIN APPLY TOPICALLY ONCE A DAY AS NEEDED FOR DRY SKIN, APPLY TO FEET DAILY AT BEDTIME AS INSTRUCTED   levothyroxine 88 MCG  tablet Commonly known as:  SYNTHROID, LEVOTHROID TAKE ONE TABLET BY MOUTH ONE TIME DAILY   loperamide 2 MG capsule Commonly known as:  IMODIUM TAKE 1 CAPSULE EVERY DAY   metroNIDAZOLE 0.75 % cream Commonly known as:  METROCREAM Apply 1 application topically daily as needed (rosacea). Reported on 01/28/2015   omeprazole 40 MG capsule Commonly known as:  PRILOSEC Take 1 capsule (40 mg total) by mouth daily.          Objective:    BP 105/69   Pulse 76   Temp 98.5 F (36.9 C) (Oral)   Ht 4\' 8"  (1.422 m)   Wt 178 lb 8 oz (81 kg)   BMI 40.02 kg/m   Wt Readings from Last 3 Encounters:  10/08/15 178 lb 8 oz (81 kg)  04/23/15 185 lb (83.9 kg)  04/21/15 185 lb (83.9 kg)    Physical Exam  Constitutional: She is oriented to person, place, and time. She appears well-developed and well-nourished. No distress.  Eyes: Conjunctivae are normal.  Cardiovascular: Normal rate, regular rhythm, normal heart sounds and intact distal pulses.   No murmur heard. Pulmonary/Chest: Effort normal and breath sounds normal. No respiratory distress. She has no wheezes.  Musculoskeletal: Normal range of motion. She exhibits no edema or tenderness.       Right wrist: She exhibits normal range of motion and no tenderness.  Left wrist: She exhibits normal range of motion, no tenderness and no bony tenderness.  Patient has small ganglion cyst on both of her wrists. The right smaller than the left. No erythema or warmth or tenderness  Neurological: She is alert and oriented to person, place, and time. Coordination normal.  Skin: Skin is warm and dry. No rash noted. She is not diaphoretic.  Psychiatric: She has a normal mood and affect. Her behavior is normal.  Nursing note and vitals reviewed.     Assessment & Plan:   Problem List Items Addressed This Visit      Other   Bilateral ganglion cysts of wrists - Primary    Other Visit Diagnoses   None.      The left one is bigger than the right,  patient is opting not to have draining today as they are nontender and will return in the future if they increase in size or need to be drained.   Follow up plan: Return if symptoms worsen or fail to improve.  Counseling provided for all of the vaccine components Orders Placed This Encounter  Procedures  . Flu Vaccine QUAD 36+ mos IM    Caryl Pina, MD [facility] 10/08/2015, 10:43 AM

## 2015-11-24 ENCOUNTER — Other Ambulatory Visit: Payer: Self-pay | Admitting: Internal Medicine

## 2016-01-05 ENCOUNTER — Other Ambulatory Visit: Payer: Self-pay | Admitting: Internal Medicine

## 2016-02-03 ENCOUNTER — Other Ambulatory Visit: Payer: Self-pay | Admitting: Internal Medicine

## 2016-02-08 ENCOUNTER — Encounter: Payer: Self-pay | Admitting: *Deleted

## 2016-02-09 ENCOUNTER — Ambulatory Visit (INDEPENDENT_AMBULATORY_CARE_PROVIDER_SITE_OTHER): Payer: Medicare Other | Admitting: Internal Medicine

## 2016-02-09 ENCOUNTER — Encounter: Payer: Self-pay | Admitting: Internal Medicine

## 2016-02-09 VITALS — BP 94/64 | HR 84 | Ht <= 58 in | Wt 176.4 lb

## 2016-02-09 DIAGNOSIS — R0989 Other specified symptoms and signs involving the circulatory and respiratory systems: Secondary | ICD-10-CM

## 2016-02-09 DIAGNOSIS — K529 Noninfective gastroenteritis and colitis, unspecified: Secondary | ICD-10-CM | POA: Diagnosis not present

## 2016-02-09 DIAGNOSIS — C49A2 Gastrointestinal stromal tumor of stomach: Secondary | ICD-10-CM

## 2016-02-09 DIAGNOSIS — K219 Gastro-esophageal reflux disease without esophagitis: Secondary | ICD-10-CM

## 2016-02-09 DIAGNOSIS — F458 Other somatoform disorders: Secondary | ICD-10-CM | POA: Diagnosis not present

## 2016-02-09 MED ORDER — COLESTIPOL HCL 1 G PO TABS: 2.0000 g | ORAL_TABLET | Freq: Every day | ORAL | 3 refills | Status: DC

## 2016-02-09 MED ORDER — OMEPRAZOLE 40 MG PO CPDR: 40.0000 mg | DELAYED_RELEASE_CAPSULE | Freq: Every day | ORAL | 3 refills | Status: DC

## 2016-02-09 MED ORDER — LOPERAMIDE HCL 2 MG PO CAPS: ORAL_CAPSULE | ORAL | 3 refills | Status: DC

## 2016-02-09 NOTE — Patient Instructions (Signed)
You have been scheduled for an endoscopy. Please follow written instructions given to you at your visit today. If you use inhalers (even only as needed), please bring them with you on the day of your procedure. Your physician has requested that you go to www.startemmi.com and enter the access code given to you at your visit today. This web site gives a general overview about your procedure. However, you should still follow specific instructions given to you by our office regarding your preparation for the procedure.  Please take Colestipol 2 grams daily  Take loperamide 2-4 mg 1-2 times daily.  Take omeprazole 40 mg once daily.  If you are age 38 or older, your body mass index should be between 23-30. Your Body mass index is 29.44 kg/m. If this is out of the aforementioned range listed, please consider follow up with your Primary Care Provider.  If you are age 50 or younger, your body mass index should be between 19-25. Your Body mass index is 29.44 kg/m. If this is out of the aformentioned range listed, please consider follow up with your Primary Care Provider.

## 2016-02-09 NOTE — Progress Notes (Signed)
Subjective:    Patient ID: Sara Mcgee, female    DOB: 10-04-1980, 36 y.o.   MRN: [id number]  HPI Sara Mcgee is a 36 year old female with Down syndrome, history of proximal gastric GIST, history of gallstones and CBD stone status post ERCP with stone extraction and cholecystectomy, history of chronic loose stools and GERD who is here for follow-up. She is here today with her sister-in-law.  In April 2017 she underwent laparoscopic proximal gastric resection for GIST as well as cholecystectomy with Dr. Barry Dienes. She recovered from this well.  This was found to be a gastrointestinal stromal tumor measuring 4.7 cm. Tumor was present at the multiple margins. This was a low-grade overall tumor. This was discussed at multidisciplinary cancer conference and the decision was made to repeat cross-sectional imaging at one year and repeat endoscopy. She has follow-up with Dr. Barry Dienes in several months.  She and her sister-in-law report that on the whole she has been doing well. She has improvement in her reflux, heartburn and indigestion symptoms with omeprazole 40 mg daily. Her swallowing issues have improved. She has lost about 10 pounds and she attributes this to improvement in her diet and also avoiding overeating. They deny dysphagia. No nausea. She has ongoing issues with loose stools occurring 3-5 times per day, worse after eating. This may have worsened slightly after cholecystectomy. She is using Imodium 2 mg on most days which doesn't seem to help much. Stools remain nonbloody and without melena. Previously this did not improve with rifaximin. Stool studies were unremarkable and fecal elastase was normal.  Review of Systems As per history of present illness, otherwise negative  Current Medications, Allergies, Past Medical History, Past Surgical History, Family History and Social History were reviewed in [employer] record.     Objective:   Physical Exam BP 94/64 (BP Location: Left  Arm, Patient Position: Sitting, Cuff Size: Normal)   Pulse 84   Ht 4' 7.25" (1.403 m)   Wt 176 lb 6 oz (80 kg)   LMP 01/19/2016   BMI 40.62 kg/m  Constitutional: Well-developed and well-nourished. No distress. HEENT:  Oropharynx is clear and moist. No oropharyngeal exudate. Conjunctivae are normal.  No scleral icterus. Neck: Neck supple. Trachea midline. Cardiovascular: Normal rate, regular rhythm and intact distal pulses. No M/R/G Pulmonary/chest: Effort normal and breath sounds normal. No wheezing, rales or rhonchi. Abdominal: Soft, obese, nontender, nondistended. Bowel sounds active throughout. Well-healed laparoscopic scars Extremities: no clubbing, cyanosis, or edema Lymphadenopathy: No cervical adenopathy noted. Neurological: Alert and oriented to person place and time. Skin: Skin is warm and dry.  Psychiatric: Normal mood and affect. Behavior is normal.      Assessment & Plan:  36 year old female with Down syndrome, history of proximal gastric GIST, history of gallstones and CBD stone status post ERCP with stone extraction and cholecystectomy, history of chronic loose stools and GERD who is here for follow-up.  1. GIST of proximal stomach -- status post partial gastrectomy resection. Has follow-up with Dr. Barry Dienes. Dr. Barry Dienes has plan repeat cross-sectional imaging with CT scan after this follow-up appointment. Case was discussed at multidisciplinary cancer conference/year with the plan to repeat surveillance imaging and EGD. We discussed proceeding with surveillance EGD around March or April of this year. We discussed the risks, benefits and alternatives and they wish to proceed.  2. GERD -- much better control with omeprazole. We'll continue 40 mg daily before breakfast  3. Chronic loose stools -- no response to rifaximin. Stool studies unremarkable.  Likely a component of irritable bowel and bile salt diarrhea. Will add colestipol 2 g daily. Dose can be titrated as needed. Sheis  separate this from other medications by at least 2 hours. Can continue Imodium but would increase to 4 mg on an as-needed basis up to 3 doses daily. We have previously considered colonoscopy but the patient sister-in-law does not think she will be able to faster tolerate the prep.  6 to 12 month follow-up; can assess response to colestipol at time of EGD 25 minutes spent with the patient today. Greater than 50% was spent in counseling and coordination of care with the patient

## 2016-02-11 ENCOUNTER — Encounter: Payer: Self-pay | Admitting: Family Medicine

## 2016-02-11 ENCOUNTER — Ambulatory Visit (INDEPENDENT_AMBULATORY_CARE_PROVIDER_SITE_OTHER): Payer: Medicare Other | Admitting: Family Medicine

## 2016-02-11 VITALS — BP 116/75 | HR 63 | Temp 98.5°F | Ht <= 58 in | Wt 176.0 lb

## 2016-02-11 DIAGNOSIS — E7800 Pure hypercholesterolemia, unspecified: Secondary | ICD-10-CM

## 2016-02-11 DIAGNOSIS — E039 Hypothyroidism, unspecified: Secondary | ICD-10-CM

## 2016-02-11 DIAGNOSIS — Z0000 Encounter for general adult medical examination without abnormal findings: Secondary | ICD-10-CM | POA: Diagnosis not present

## 2016-02-11 DIAGNOSIS — L719 Rosacea, unspecified: Secondary | ICD-10-CM

## 2016-02-11 DIAGNOSIS — Z131 Encounter for screening for diabetes mellitus: Secondary | ICD-10-CM

## 2016-02-11 DIAGNOSIS — R8290 Unspecified abnormal findings in urine: Secondary | ICD-10-CM

## 2016-02-11 LAB — URINALYSIS, COMPLETE
BILIRUBIN UA: NEGATIVE
GLUCOSE, UA: NEGATIVE
Ketones, UA: NEGATIVE
Nitrite, UA: NEGATIVE
SPEC GRAV UA: 1.015 (ref 1.005–1.030)
Urobilinogen, Ur: 0.2 mg/dL (ref 0.2–1.0)
pH, UA: 6 (ref 5.0–7.5)

## 2016-02-11 LAB — MICROSCOPIC EXAMINATION
Epithelial Cells (non renal): >10 /hpf — AB (ref 0–10)
Renal Epithel, UA: NONE SEEN /hpf

## 2016-02-11 MED ORDER — IVERMECTIN 1 % EX CREA: 1.0000 "application " | TOPICAL_CREAM | Freq: Every day | CUTANEOUS | 5 refills | Status: DC

## 2016-02-11 NOTE — Progress Notes (Signed)
BP 116/75   Pulse 63   Temp 98.5 F (36.9 C) (Oral)   Ht 4' 7.25" (1.403 m)   Wt 176 lb (79.8 kg)   LMP 01/19/2016   BMI 40.54 kg/m    Subjective:    Patient ID: Sara Mcgee, female    DOB: [birth year], 36 y.o.   MRN: [id number]  HPI: Sara Mcgee is a 36 y.o. female presenting on 02/11/2016 for Annual Exam (pt here today for CPE and medication refills)   HPI Well adult exam and recheck on cholesterol and thyroid Patient is coming in today for well exam and screening labs and recheck on her cholesterol or thyroid. She is currently on levothyroxine 88 g. She is also trying to change dietary habits to control her cholesterol. She is due for recheck on both today. She denies any chest pain, shortness of breath, headaches or vision issues, abdominal complaints, diarrhea, nausea, vomiting, or joint issues.   Rash/rosacea Patient has been diagnosed with rosacea in the past and has been on topical metronidazole and mother here with her does not feel like it is been helping at all. The rash has not changed much and there is no erythema or warmth or drainage and intravenous sites. Rash the flat pink lesions that are scattered over her cheeks and forehead. Nonpruritic  Relevant past medical, surgical, family and social history reviewed and updated as indicated. Interim medical history since our last visit reviewed. Allergies and medications reviewed and updated.  Review of Systems  Constitutional: Negative for chills and fever.  HENT: Negative for congestion, ear discharge and ear pain.   Eyes: Negative for redness and visual disturbance.  Respiratory: Negative for chest tightness and shortness of breath.   Cardiovascular: Negative for chest pain and leg swelling.  Genitourinary: Negative for difficulty urinating and dysuria.  Musculoskeletal: Negative for back pain and gait problem.  Skin: Positive for rash.  Neurological: Negative for light-headedness and headaches.    Psychiatric/Behavioral: Negative for agitation and behavioral problems.  All other systems reviewed and are negative.   Per HPI unless specifically indicated above   Allergies as of 02/11/2016      Reactions   Adhesive [tape] Dermatitis   Blistering rash from drape adhesive in OR>        Medication List       Accurate as of 02/11/16 11:37 AM. Always use your most recent med list.          ammonium lactate 12 % cream Commonly known as:  AMLACTIN APPLY TOPICALLY ONCE A DAY AS NEEDED FOR DRY SKIN, APPLY TO FEET DAILY AT BEDTIME AS INSTRUCTED   colestipol 1 g tablet Commonly known as:  COLESTID Take 2 tablets (2 g total) by mouth daily.   levothyroxine 88 MCG tablet Commonly known as:  SYNTHROID, LEVOTHROID TAKE ONE TABLET BY MOUTH ONE TIME DAILY   loperamide 2 MG capsule Commonly known as:  IMODIUM Take 1-2 tablets by mouth 1-2 times daily   omeprazole 40 MG capsule Commonly known as:  PRILOSEC Take 1 capsule (40 mg total) by mouth daily.          Objective:    BP 116/75   Pulse 63   Temp 98.5 F (36.9 C) (Oral)   Ht 4' 7.25" (1.403 m)   Wt 176 lb (79.8 kg)   LMP 01/19/2016   BMI 40.54 kg/m   Wt Readings from Last 3 Encounters:  02/11/16 176 lb (79.8 kg)  02/09/16 176 lb 6  oz (80 kg)  10/08/15 178 lb 8 oz (81 kg)    Physical Exam  Constitutional: She is oriented to person, place, and time. She appears well-developed and well-nourished. No distress.  Eyes: Conjunctivae are normal.  Cardiovascular: Normal rate, regular rhythm, normal heart sounds and intact distal pulses.   No murmur heard. Pulmonary/Chest: Effort normal and breath sounds normal. No respiratory distress. She has no wheezes. She has no rales.  Musculoskeletal: Normal range of motion. She exhibits no edema or tenderness.  Neurological: She is alert and oriented to person, place, and time. Coordination normal.  Skin: Skin is warm and dry. Rash (Small pink macules scattered on cheeks and  forehead. Well-defined, no erythema or warmth or drainage., Nontender) noted. She is not diaphoretic.  Psychiatric: She has a normal mood and affect. Her behavior is normal.  Nursing note and vitals reviewed.     Assessment & Plan:   Problem List Items Addressed This Visit      Endocrine   Hypothyroidism - Primary   Relevant Orders   CBC with Differential/Platelet   TSH     Other   HYPERCHOLESTEROLEMIA   Relevant Orders   Lipid panel    Other Visit Diagnoses    Well adult exam       Relevant Orders   CMP14+EGFR   CBC with Differential/Platelet   Lipid panel   TSH   Urinalysis, Complete   Screening for diabetes mellitus       Rosacea           Follow up plan: Return in about 6 months (around 08/10/2016), or if symptoms worsen or fail to improve, for Recheck cholesterol.  Counseling provided for all of the vaccine components Orders Placed This Encounter  Procedures  . CMP14+EGFR  . CBC with Differential/Platelet  . Lipid panel  . TSH  . Urinalysis, Complete    Caryl Pina, MD [facility] 02/11/2016, 11:37 AM

## 2016-02-11 NOTE — Addendum Note (Signed)
Addended by: Thana Ates on: 02/11/2016 04:28 PM   Modules accepted: Orders

## 2016-02-12 LAB — CBC WITH DIFFERENTIAL/PLATELET
BASOS ABS: 0 10*3/uL (ref 0.0–0.2)
BASOS: 1 %
EOS (ABSOLUTE): 0 10*3/uL (ref 0.0–0.4)
Eos: 1 %
HEMOGLOBIN: 15.1 g/dL (ref 11.1–15.9)
Hematocrit: 44.9 % (ref 34.0–46.6)
IMMATURE GRANS (ABS): 0 10*3/uL (ref 0.0–0.1)
Immature Granulocytes: 0 %
LYMPHS ABS: 1.8 10*3/uL (ref 0.7–3.1)
LYMPHS: 29 %
MCH: 33.1 pg — ABNORMAL HIGH (ref 26.6–33.0)
MCHC: 33.6 g/dL (ref 31.5–35.7)
MCV: 99 fL — ABNORMAL HIGH (ref 79–97)
MONOCYTES: 7 %
Monocytes Absolute: 0.5 10*3/uL (ref 0.1–0.9)
NEUTROS ABS: 3.9 10*3/uL (ref 1.4–7.0)
Neutrophils: 62 %
Platelets: 261 10*3/uL (ref 150–379)
RBC: 4.56 x10E6/uL (ref 3.77–5.28)
RDW: 13.7 % (ref 12.3–15.4)
WBC: 6.3 10*3/uL (ref 3.4–10.8)

## 2016-02-12 LAB — CMP14+EGFR
A/G RATIO: 1.2 (ref 1.2–2.2)
ALBUMIN: 4.1 g/dL (ref 3.5–5.5)
ALT: 16 IU/L (ref 0–32)
AST: 14 IU/L (ref 0–40)
Alkaline Phosphatase: 76 IU/L (ref 39–117)
BILIRUBIN TOTAL: 0.5 mg/dL (ref 0.0–1.2)
BUN / CREAT RATIO: 10 (ref 9–23)
BUN: 10 mg/dL (ref 6–20)
CALCIUM: 9.6 mg/dL (ref 8.7–10.2)
CHLORIDE: 104 mmol/L (ref 96–106)
CO2: 24 mmol/L (ref 18–29)
Creatinine, Ser: 1.01 mg/dL — ABNORMAL HIGH (ref 0.57–1.00)
GFR, EST AFRICAN AMERICAN: 84 mL/min/{1.73_m2} (ref >59)
GFR, EST NON AFRICAN AMERICAN: 73 mL/min/{1.73_m2} (ref >59)
Globulin, Total: 3.4 g/dL (ref 1.5–4.5)
Glucose: 82 mg/dL (ref 65–99)
POTASSIUM: 5 mmol/L (ref 3.5–5.2)
Sodium: 143 mmol/L (ref 134–144)
TOTAL PROTEIN: 7.5 g/dL (ref 6.0–8.5)

## 2016-02-12 LAB — LIPID PANEL
CHOLESTEROL TOTAL: 165 mg/dL (ref 100–199)
Chol/HDL Ratio: 4 ratio units (ref 0.0–4.4)
HDL: 41 mg/dL (ref >39)
LDL CALC: 104 mg/dL — AB (ref 0–99)
Triglycerides: 98 mg/dL (ref 0–149)
VLDL CHOLESTEROL CAL: 20 mg/dL (ref 5–40)

## 2016-02-12 LAB — TSH: TSH: 3.25 u[IU]/mL (ref 0.450–4.500)

## 2016-02-13 LAB — URINE CULTURE

## 2016-02-16 ENCOUNTER — Telehealth: Payer: Self-pay | Admitting: Family Medicine

## 2016-02-16 MED ORDER — CEPHALEXIN 500 MG PO CAPS: 500.0000 mg | ORAL_CAPSULE | Freq: Four times a day (QID) | ORAL | 0 refills | Status: DC

## 2016-02-16 NOTE — Telephone Encounter (Signed)
-----   Message from Georga Kaufmann, LPN sent at [id number]  4:32 PM EST ----- No notes on urine culture yet

## 2016-02-16 NOTE — Telephone Encounter (Signed)
Sara Mcgee aware of results

## 2016-02-17 ENCOUNTER — Other Ambulatory Visit: Payer: Self-pay | Admitting: Family Medicine

## 2016-02-17 MED ORDER — AZELAIC ACID 20 % EX CREA: TOPICAL_CREAM | CUTANEOUS | 2 refills | Status: DC

## 2016-02-17 NOTE — Progress Notes (Signed)
Aware of keflex script and substitute for cream.

## 2016-02-18 ENCOUNTER — Other Ambulatory Visit: Payer: Self-pay | Admitting: Nurse Practitioner

## 2016-02-19 ENCOUNTER — Other Ambulatory Visit: Payer: Self-pay | Admitting: Nurse Practitioner

## 2016-02-25 ENCOUNTER — Other Ambulatory Visit: Payer: Self-pay | Admitting: Internal Medicine

## 2016-02-25 DIAGNOSIS — R0989 Other specified symptoms and signs involving the circulatory and respiratory systems: Secondary | ICD-10-CM

## 2016-04-06 ENCOUNTER — Telehealth: Payer: Self-pay

## 2016-04-06 MED ORDER — DIAZEPAM 10 MG PO TABS: 10.0000 mg | ORAL_TABLET | ORAL | 0 refills | Status: DC

## 2016-04-06 NOTE — Telephone Encounter (Signed)
Left voicemail for Jenine to call back. Per Dr Hilarie Fredrickson, patient may have valium 5 mg either in liquid or tablet form.

## 2016-04-06 NOTE — Telephone Encounter (Signed)
After speaking with Sara Mcgee, she states that patient didn't do well with 5 mg valium last time. She didn't seem to calm down enough. Per Dr Hilarie Fredrickson, patient may have 1 10 mg valium 90 minutes before procedure. I have sent rx to pharmacy and left message advising Sara Mcgee.

## 2016-04-07 ENCOUNTER — Other Ambulatory Visit: Payer: Self-pay

## 2016-04-07 ENCOUNTER — Telehealth: Payer: Self-pay

## 2016-04-07 ENCOUNTER — Ambulatory Visit (AMBULATORY_SURGERY_CENTER): Payer: Medicare Other | Admitting: Internal Medicine

## 2016-04-07 ENCOUNTER — Encounter: Payer: Self-pay | Admitting: Internal Medicine

## 2016-04-07 VITALS — BP 101/65 | HR 86 | Temp 97.8°F | Resp 16 | Ht <= 58 in | Wt 176.0 lb

## 2016-04-07 DIAGNOSIS — Z8509 Personal history of malignant neoplasm of other digestive organs: Secondary | ICD-10-CM

## 2016-04-07 DIAGNOSIS — C49A4 Gastrointestinal stromal tumor of large intestine: Secondary | ICD-10-CM

## 2016-04-07 DIAGNOSIS — K219 Gastro-esophageal reflux disease without esophagitis: Secondary | ICD-10-CM | POA: Diagnosis not present

## 2016-04-07 MED ORDER — SODIUM CHLORIDE 0.9 % IV SOLN: 500.0000 mL | INTRAVENOUS | Status: DC

## 2016-04-07 NOTE — Op Note (Addendum)
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 The patient's tolerance of                            previous anesthesia was also reviewed. The risks                            and benefits of the procedure and the sedation                            options and risks were discussed with the patient.                            All questions were answered, and informed consent                            was obtained. Prior Anticoagulants: The patient has                            taken no previous anticoagulant or antiplatelet                            agents. ASA Grade Assessment: III - A patient with                            severe systemic disease. After reviewing the risks                            and benefits, the patient was deemed in                            satisfactory condition to undergo the procedure.  After obtaining informed consent, the endoscope was                            passed under direct vision. Throughout the                            procedure, the patient's blood pressure, pulse, and                            oxygen saturations were monitored continuously. The                            Endoscope was introduced through the mouth, and                            advanced  to the second part of duodenum. The upper                            GI endoscopy was accomplished without difficulty.                            The patient tolerated the procedure well. Scope In: Scope Out: Findings:                 The examined esophagus was normal. Z-line is                            regular at 36 cm from the incisors.                           Thickened gastric folds were found in the cardia at                            site of previous GIST resection. This is much less                            prominent than at GIST diagnosis pre-operatively                            and may represent scar tissue.                           Moderate portal hypertensive gastropathy was found                            in the cardia, in the gastric fundus and in the                            gastric body.                           The incisura and gastric antrum were normal.  Previously seen gastritis has improved.                           The examined duodenum was normal. Previously seen                            duodentitis has improved/resolved. Complications:            No immediate complications. Estimated Blood Loss:     Estimated blood loss: none. Impression:               - Normal esophagus.                           - Thickend fold in gastric cardia.                           - Portal hypertensive gastropathy.                           - Normal incisura and antrum.                           - Normal examined duodenum. Recommendation:           - Patient has a contact number available for                            emergencies. The signs and symptoms of potential                            delayed complications were discussed with the                            patient. Return to normal activities tomorrow.                            Written discharge instructions were provided to the                            patient.                            - Resume previous diet.                           - Continue present medications.                           - Consider repeat upper endoscopy versus EUS in 1                            year for surveillance. Jerene Bears, MD 04/07/2016 8:54:59 AM This report has been signed electronically.

## 2016-04-07 NOTE — Patient Instructions (Signed)
YOU HAD AN ENDOSCOPIC PROCEDURE TODAY AT [facility]:   Refer to the procedure report that was given to you for any specific questions about what was found during the examination.  If the procedure report does not answer your questions, please call your gastroenterologist to clarify.  If you requested that your care partner not be given the details of your procedure findings, then the procedure report has been included in a sealed envelope for you to review at your convenience later.  YOU SHOULD EXPECT: Some feelings of bloating in the abdomen. Passage of more gas than usual.  Walking can help get rid of the air that was put into your GI tract during the procedure and reduce the bloating. If you had a lower endoscopy (such as a colonoscopy or flexible sigmoidoscopy) you may notice spotting of blood in your stool or on the toilet paper. If you underwent a bowel prep for your procedure, you may not have a normal bowel movement for a few days.  Please Note:  You might notice some irritation and congestion in your nose or some drainage.  This is from the oxygen used during your procedure.  There is no need for concern and it should clear up in a day or so.  SYMPTOMS TO REPORT IMMEDIATELY:    Following upper endoscopy (EGD)  Vomiting of blood or coffee ground material  New chest pain or pain under the shoulder blades  Painful or persistently difficult swallowing  New shortness of breath  Fever of 100F or higher  Black, tarry-looking stools  For urgent or emergent issues, a gastroenterologist can be reached at any hour by calling [phone].   DIET:  We do recommend a small meal at first, but then you may proceed to your regular diet.  Drink plenty of fluids but you should avoid alcoholic beverages for 24 hours.  ACTIVITY:  You should plan to take it easy for the rest of today and you should NOT DRIVE or use heavy machinery until tomorrow (because of the sedation medicines used  during the test).    FOLLOW UP: Our staff will call the number listed on your records the next business day following your procedure to check on you and address any questions or concerns that you may have regarding the information given to you following your procedure. If we do not reach you, we will leave a message.  However, if you are feeling well and you are not experiencing any problems, there is no need to return our call.  We will assume that you have returned to your regular daily activities without incident.  If any biopsies were taken you will be contacted by phone or by letter within the next 1-3 weeks.  Please call us at [phone] if you have not heard about the biopsies in 3 weeks.    SIGNATURES/CONFIDENTIALITY: You and/or your care partner have signed paperwork which will be entered into your electronic medical record.  These signatures attest to the fact that that the information above on your After Visit Summary has been reviewed and is understood.  Full responsibility of the confidentiality of this discharge information lies with you and/or your care-partner.   Resume medications.Office will notify you about CT Scan.

## 2016-04-07 NOTE — Progress Notes (Signed)
Pt's states no medical or surgical changes since previsit or office visit. 

## 2016-04-07 NOTE — Progress Notes (Signed)
Dr. Hilarie Fredrickson stated that he was going to order CT Scan on pt. And that he will notify [facility]. Contrast will be given here in recovery prior to discharge.

## 2016-04-07 NOTE — Telephone Encounter (Signed)
Pt scheduled for CT of A/P at [place] [facility] 04/15/16@1 :30pm. Pt to be NPO after 9:30am, drink bottle 1 of contrast at 11:15am, bottle 2 at 12:15pm. Pt to arrive there at 1pm. Erasmo Downer RN in [facility] to notify pt of appt.

## 2016-04-07 NOTE — Progress Notes (Signed)
Report to PACU, RN, vss, BBS= Clear.  

## 2016-04-08 NOTE — Progress Notes (Signed)
Sara Mcgee in a relatively data free zone here but it's probably best to follow the thickening with EUS given her history.  If CT scan shows mild thickening then I recommend repeat EUS in 12 months.  If CT scan shows "concerning thickening" then EUS now.    Thanks DJ

## 2016-04-11 ENCOUNTER — Encounter: Payer: Self-pay | Admitting: Internal Medicine

## 2016-04-11 ENCOUNTER — Telehealth: Payer: Self-pay

## 2016-04-11 NOTE — Telephone Encounter (Signed)
Error

## 2016-04-11 NOTE — Telephone Encounter (Signed)
  Follow up Call-  Call back number 04/07/2016 01/28/2015  Post procedure Call Back phone  # [phone]  [phone]  Permission to leave phone message Yes Yes  Some recent data might be hidden     Patient questions:  Do you have a fever, pain , or abdominal swelling? No. Pain Score  0 *  Have you tolerated food without any problems? Yes.    Have you been able to return to your normal activities? Yes.    Do you have any questions about your discharge instructions: Diet   No. Medications  No. Follow up visit  No.  Do you have questions or concerns about your Care? No.  Actions: * If pain score is 4 or above: No action needed, pain <4.

## 2016-04-15 ENCOUNTER — Inpatient Hospital Stay: Admission: RE | Admit: 2016-04-15 | Payer: Medicare Other | Source: Ambulatory Visit

## 2016-04-18 ENCOUNTER — Other Ambulatory Visit: Payer: Self-pay

## 2016-04-18 ENCOUNTER — Telehealth: Payer: Self-pay | Admitting: Internal Medicine

## 2016-04-18 MED ORDER — DIAZEPAM 10 MG PO TABS: 10.0000 mg | ORAL_TABLET | ORAL | 0 refills | Status: DC

## 2016-04-18 NOTE — Telephone Encounter (Signed)
Please advise regarding valium for CT scan.

## 2016-04-18 NOTE — Telephone Encounter (Signed)
Yes, diazepam 10 mg PO 1 hour before CT

## 2016-04-19 NOTE — Telephone Encounter (Signed)
Script sent to pharmacy. Pts family aware.

## 2016-04-20 ENCOUNTER — Ambulatory Visit (INDEPENDENT_AMBULATORY_CARE_PROVIDER_SITE_OTHER)
Admission: RE | Admit: 2016-04-20 | Discharge: 2016-04-20 | Disposition: A | Discharge status: Home / Self Care | Payer: Medicare Other | Source: Ambulatory Visit | Attending: Internal Medicine | Admitting: Internal Medicine

## 2016-04-20 DIAGNOSIS — C49A4 Gastrointestinal stromal tumor of large intestine: Secondary | ICD-10-CM

## 2016-04-20 MED ORDER — IOPAMIDOL (ISOVUE-300) INJECTION 61%
100.0000 mL | Freq: Once | INTRAVENOUS | Status: AC | PRN
  Administered 2016-04-20: 100 mL via INTRAVENOUS

## 2016-04-22 ENCOUNTER — Other Ambulatory Visit: Payer: Self-pay

## 2016-04-22 DIAGNOSIS — R933 Abnormal findings on diagnostic imaging of other parts of digestive tract: Secondary | ICD-10-CM

## 2016-05-03 ENCOUNTER — Encounter (HOSPITAL_COMMUNITY): Payer: Self-pay | Admitting: *Deleted

## 2016-05-05 ENCOUNTER — Ambulatory Visit (HOSPITAL_COMMUNITY): Payer: Medicare Other | Admitting: Certified Registered Nurse Anesthetist

## 2016-05-05 ENCOUNTER — Ambulatory Visit (HOSPITAL_COMMUNITY)
Admission: RE | Admit: 2016-05-05 | Discharge: 2016-05-05 | Disposition: A | Discharge status: Home / Self Care | Payer: Medicare Other | Source: Ambulatory Visit | Attending: Gastroenterology | Admitting: Gastroenterology

## 2016-05-05 ENCOUNTER — Encounter (HOSPITAL_COMMUNITY): Payer: Self-pay | Admitting: *Deleted

## 2016-05-05 ENCOUNTER — Encounter (HOSPITAL_COMMUNITY)
Admission: RE | Disposition: A | Discharge status: Home / Self Care | Payer: Self-pay | Source: Ambulatory Visit | Attending: Gastroenterology

## 2016-05-05 DIAGNOSIS — Q909 Down syndrome, unspecified: Secondary | ICD-10-CM | POA: Diagnosis not present

## 2016-05-05 DIAGNOSIS — R933 Abnormal findings on diagnostic imaging of other parts of digestive tract: Secondary | ICD-10-CM | POA: Diagnosis not present

## 2016-05-05 DIAGNOSIS — K3189 Other diseases of stomach and duodenum: Secondary | ICD-10-CM

## 2016-05-05 DIAGNOSIS — K319 Disease of stomach and duodenum, unspecified: Secondary | ICD-10-CM | POA: Diagnosis present

## 2016-05-05 DIAGNOSIS — Z6835 Body mass index (BMI) 35.0-35.9, adult: Secondary | ICD-10-CM | POA: Diagnosis not present

## 2016-05-05 DIAGNOSIS — K219 Gastro-esophageal reflux disease without esophagitis: Secondary | ICD-10-CM | POA: Insufficient documentation

## 2016-05-05 DIAGNOSIS — E039 Hypothyroidism, unspecified: Secondary | ICD-10-CM | POA: Insufficient documentation

## 2016-05-05 DIAGNOSIS — Z9049 Acquired absence of other specified parts of digestive tract: Secondary | ICD-10-CM | POA: Insufficient documentation

## 2016-05-05 HISTORY — PX: EUS: SHX5427

## 2016-05-05 HISTORY — DX: Hypothyroidism, unspecified: E03.9

## 2016-05-05 SURGERY — UPPER ENDOSCOPIC ULTRASOUND (EUS) LINEAR
Anesthesia: Monitor Anesthesia Care

## 2016-05-05 MED ORDER — LIDOCAINE 2% (20 MG/ML) 5 ML SYRINGE
INTRAMUSCULAR | Status: AC
  Filled 2016-05-05: qty 5

## 2016-05-05 MED ORDER — PROPOFOL 10 MG/ML IV BOLUS
INTRAVENOUS | Status: AC
  Filled 2016-05-05: qty 40

## 2016-05-05 MED ORDER — LIDOCAINE 2% (20 MG/ML) 5 ML SYRINGE
INTRAMUSCULAR | Status: DC | PRN
  Administered 2016-05-05: 100 mg via INTRAVENOUS

## 2016-05-05 MED ORDER — ONDANSETRON HCL 4 MG/2ML IJ SOLN
INTRAMUSCULAR | Status: AC
  Filled 2016-05-05: qty 2

## 2016-05-05 MED ORDER — PROPOFOL 10 MG/ML IV BOLUS: INTRAVENOUS | Status: DC | PRN

## 2016-05-05 MED ORDER — PHENYLEPHRINE HCL 10 MG/ML IJ SOLN
INTRAMUSCULAR | Status: DC | PRN
  Administered 2016-05-05 (×2): 80 ug via INTRAVENOUS

## 2016-05-05 MED ORDER — PROPOFOL 10 MG/ML IV BOLUS
INTRAVENOUS | Status: DC | PRN
  Administered 2016-05-05: 50 mg via INTRAVENOUS

## 2016-05-05 MED ORDER — DEXAMETHASONE SODIUM PHOSPHATE 10 MG/ML IJ SOLN
INTRAMUSCULAR | Status: DC | PRN
  Administered 2016-05-05: 10 mg via INTRAVENOUS

## 2016-05-05 MED ORDER — PROPOFOL 500 MG/50ML IV EMUL
INTRAVENOUS | Status: DC | PRN
  Administered 2016-05-05: 150 ug/kg/min via INTRAVENOUS

## 2016-05-05 MED ORDER — SODIUM CHLORIDE 0.9 % IV SOLN: INTRAVENOUS | Status: DC

## 2016-05-05 MED ORDER — ONDANSETRON HCL 4 MG/2ML IJ SOLN
INTRAMUSCULAR | Status: DC | PRN
  Administered 2016-05-05: 4 mg via INTRAVENOUS

## 2016-05-05 MED ORDER — LACTATED RINGERS IV SOLN
INTRAVENOUS | Status: DC
  Administered 2016-05-05: 10:00:00 via INTRAVENOUS

## 2016-05-05 MED ORDER — SUCCINYLCHOLINE CHLORIDE 20 MG/ML IJ SOLN
INTRAMUSCULAR | Status: DC | PRN
  Administered 2016-05-05: 80 mg via INTRAVENOUS

## 2016-05-05 MED ORDER — PROPOFOL 10 MG/ML IV BOLUS
INTRAVENOUS | Status: AC
  Filled 2016-05-05: qty 20

## 2016-05-05 NOTE — Addendum Note (Signed)
Addendum  created 05/05/16 1242 by West Pugh, CRNA   Anesthesia Intra Flowsheets edited

## 2016-05-05 NOTE — Discharge Instructions (Signed)

## 2016-05-05 NOTE — Anesthesia Preprocedure Evaluation (Signed)
Anesthesia Evaluation  Patient identified by MRN, date of birth, ID band Patient awake    Reviewed: Allergy & Precautions, NPO status , Patient's Chart, lab work & pertinent test results  Airway Mallampati: III  TM Distance: <3 FB Neck ROM: Full    Dental no notable dental hx.    Pulmonary neg pulmonary ROS,    Pulmonary exam normal breath sounds clear to auscultation       Cardiovascular negative cardio ROS Normal cardiovascular exam Rhythm:Regular Rate:Normal     Neuro/Psych Downs Syndrome negative psych ROS   GI/Hepatic Neg liver ROS, GERD  ,  Endo/Other  Hypothyroidism Morbid obesity  Renal/GU negative Renal ROS  negative genitourinary   Musculoskeletal negative musculoskeletal ROS (+)   Abdominal   Peds negative pediatric ROS (+)  Hematology negative hematology ROS (+)   Anesthesia Other Findings   Reproductive/Obstetrics negative OB ROS                             Anesthesia Physical Anesthesia Plan  ASA: III  Anesthesia Plan: MAC   Post-op Pain Management:    Induction: Intravenous  Airway Management Planned: Nasal Cannula  Additional Equipment:   Intra-op Plan:   Post-operative Plan:   Informed Consent: I have reviewed the patients History and Physical, chart, labs and discussed the procedure including the risks, benefits and alternatives for the proposed anesthesia with the patient or authorized representative who has indicated his/her understanding and acceptance.   Dental advisory given  Plan Discussed with: CRNA and Surgeon  Anesthesia Plan Comments:         Anesthesia Quick Evaluation

## 2016-05-05 NOTE — Anesthesia Procedure Notes (Signed)
Procedure Name: Intubation Date/Time: 05/05/2016 11:00 AM Performed by: West Pugh Pre-anesthesia Checklist: Patient identified, Emergency Drugs available, Suction available, Patient being monitored and Timeout performed Patient Re-evaluated:Patient Re-evaluated prior to inductionOxygen Delivery Method: Circle system utilized Preoxygenation: Pre-oxygenation with 100% oxygen Intubation Type: IV induction Ventilation: Mask ventilation without difficulty Laryngoscope Size: Mac and 3 Grade View: Grade I Tube type: Oral Tube size: 7.0 mm Number of attempts: 1 Airway Equipment and Method: Stylet Placement Confirmation: ETT inserted through vocal cords under direct vision,  positive ETCO2,  CO2 detector and breath sounds checked- equal and bilateral Secured at: 21 cm Tube secured with: Tape Dental Injury: Teeth and Oropharynx as per pre-operative assessment

## 2016-05-05 NOTE — Transfer of Care (Signed)
Immediate Anesthesia Transfer of Care Note  Patient: Sara Mcgee  Procedure(s) Performed: Procedure(s) with comments: UPPER ENDOSCOPIC ULTRASOUND (EUS) LINEAR (N/A) - General?  Patient Location: PACU  Anesthesia Type:General  Level of Consciousness:  sedated, patient cooperative and responds to stimulation  Airway & Oxygen Therapy:Patient Spontanous Breathing and Patient connected to face mask oxgen  Post-op Assessment:  Report given to PACU RN and Post -op Vital signs reviewed and stable  Post vital signs:  Reviewed and stable  Last Vitals:  Vitals:   05/05/16 0930 05/05/16 0943  BP:  110/80  Resp: 13   Temp: 86.5 C     Complications: No apparent anesthesia complications

## 2016-05-05 NOTE — Addendum Note (Signed)
Addendum  created 05/05/16 1235 by West Pugh, CRNA   Anesthesia Intra Flowsheets edited

## 2016-05-05 NOTE — Op Note (Addendum)
[facility] Patient Name: Sara Mcgee Procedure Date: 05/05/2016 MRN: [id number] Attending MD: [name] , MD Date of Birth: [birth year] CSN: [id number] Age: 36 Admit Type: Outpatient Procedure:                Upper EUS Indications:              Gastric mucosal mass/polyp found on endoscopy Providers:                [name], MD, Cleda Daub, RN, William Dalton, Technician Referring MD:              Medicines:                General Anesthesia Complications:            No immediate complications. Estimated blood loss:                            None. Estimated Blood Loss:     Estimated blood loss: none. Procedure:                Pre-Anesthesia Assessment:                           - Prior to the procedure, a History and Physical                            was performed, and patient medications and                            allergies were reviewed. The patient's tolerance of                            previous anesthesia was also reviewed. The risks                            and benefits of the procedure and the sedation                            options and risks were discussed with the patient.                            All questions were answered, and informed consent                            was obtained. Prior Anticoagulants: The patient has                            taken no previous anticoagulant or antiplatelet                            agents. ASA Grade Assessment: III - A patient with  severe systemic disease. After reviewing the risks                            and benefits, the patient was deemed in                            satisfactory condition to undergo the procedure.                           After obtaining informed consent, the endoscope was                            passed under direct vision. Throughout the                            procedure, the patient's blood  pressure, pulse, and                            oxygen saturations were monitored continuously. The                            [id number] ([id number]) scope was introduced through                            the mouth, and advanced to the second part of                            duodenum. The [id number] ([id number]) scope was                            introduced through the mouth, and advanced to the                            antrum of the stomach. The upper EUS was                            accomplished without difficulty. The patient                            tolerated the procedure well. Scope In: Scope Out: Findings:      Endoscopic Finding :      2. The visualized stomach was normal however echoendoscope does not       allow good endscopic views of the proximal stomach due to it's long,       inflexible tip      Endosonographic Finding :      1. An oblong, tri-lobed intramural (subepithelial) lesion was found in       the cardia of the stomach. There is clearly anatomic distortion related       to previous gastric surgery but there also is apparent hypoechoic soft       tissue mass. Sonographically, the lesion appeared to originate from the       muscularis propria (Layer 4). The lesion measured 31 mm (in maximum       thickness). The outer endosonographic borders were well  defined. Fine       needle aspiration for cytology was performed. Color Doppler imaging was       utilized prior to needle puncture to confirm a lack of significant       vascular structures within the needle path. Three passes were made with       the 25 gauge needle using a transgastric approach. A stylet was used. A       cytotechnologist was present to evaluate the adequacy of the specimen.       Final cytology results are pending.      2. No perigastric adenopathy.      3. Limited views of liver, pancreas, spleen were normal. Impression:               - There is clearly anatomic distortion related to                             previous gastric surgery but there also is apparent                            oblong hypoechoic soft tissue mass measuring 3.1cm                            maximally. Transgastric FNA performed, awaiting                            final pathology results. If the cytology is                            non-diagnostic I would likely recommend repeat                            imaging (CT vs. EUS) in 12 months. The surgical                            margins were + at time of 4.7cm GIST resection but                            the mass histology itselt was very bland with "no                            significant mitotic activity." Moderate Sedation:      N/A- Per Anesthesia Care Recommendation:           - Discharge patient to home (ambulatory).                           - Await pathology results. Procedure Code(s):        --- Professional ---                           [phone], Esophagogastroduodenoscopy, flexible,                            transoral; with transendoscopic ultrasound-guided  intramural or transmural fine needle                            aspiration/biopsy(s), (includes endoscopic                            ultrasound examination limited to the esophagus,                            stomach or duodenum, and adjacent structures) Diagnosis Code(s):        --- Professional ---                           K31.89, Other diseases of stomach and duodenum CPT copyright 2016 American Medical Association. All rights reserved. The codes documented in this report are preliminary and upon coder review may  be revised to meet current compliance requirements. [name], MD 05/05/2016 11:33:22 AM This report has been signed electronically. Number of Addenda: 0

## 2016-05-05 NOTE — Anesthesia Postprocedure Evaluation (Addendum)
Anesthesia Post Note  Patient: Sara Mcgee  Procedure(s) Performed: Procedure(s) (LRB): UPPER ENDOSCOPIC ULTRASOUND (EUS) LINEAR (N/A)  Patient location during evaluation: PACU Anesthesia Type: MAC Level of consciousness: awake and alert Pain management: pain level controlled Vital Signs Assessment: post-procedure vital signs reviewed and stable Respiratory status: spontaneous breathing, nonlabored ventilation, respiratory function stable and patient connected to nasal cannula oxygen Cardiovascular status: blood pressure returned to baseline and stable Postop Assessment: no signs of nausea or vomiting Anesthetic complications: no       Last Vitals:  Vitals:   05/05/16 0943 05/05/16 1127  BP: 110/80 (!) 106/55  Pulse:  94  Resp:  15  Temp:  36.3 C    Last Pain:  Vitals:   05/05/16 1127  TempSrc: Oral                 [name]

## 2016-05-05 NOTE — H&P (Signed)
HPI: This is a 36 yo woman  Chief complaint is abnormal EGD, abnormal CT scan; s/p partial proximal gastrectomy for 4.7cm GIST, + margins, no concerning cytologic features however.   ROS: complete GI ROS as described in HPI.  Constitutional:  No unintentional weight loss   Past Medical History:  Diagnosis Date  . Bilateral external ear infections   . Blisters of multiple sites    on lips since scan  . Down's syndrome   . Dry skin    feet  . Dyslipidemia   . Esophageal dysmotility   . Gallstones   . Gastrointestinal stromal tumor (GIST) of stomach ([place])   . GERD (gastroesophageal reflux disease)   . Hard of hearing   . Hepatic steatosis   . Hypothyroidism   . PONV (postoperative nausea and vomiting)    after last endo at [facility]    Past Surgical History:  Procedure Laterality Date  . CHOLECYSTECTOMY N/A 04/23/2015   Procedure: LAPAROSCOPIC CHOLECYSTECTOMY WITH INTRAOPERATIVE CHOLANGIOGRAM;  Surgeon: Stark Klein, MD;  Location: [facility];  Service: General;  Laterality: N/A;  . ENDOSCOPIC RETROGRADE CHOLANGIOPANCREATOGRAPHY (ERCP) WITH PROPOFOL N/A 02/26/2015   Procedure: ENDOSCOPIC RETROGRADE CHOLANGIOPANCREATOGRAPHY (ERCP) WITH PROPOFOL;  Surgeon: [name], MD;  Location: [facility];  Service: Endoscopy;  Laterality: N/A;  . ESOPHAGOGASTRODUODENOSCOPY N/A 04/23/2015   Procedure: ESOPHAGOGASTRODUODENOSCOPY (EGD);  Surgeon: Stark Klein, MD;  Location: [facility];  Service: General;  Laterality: N/A;  . EUS N/A 02/26/2015   Procedure: UPPER ENDOSCOPIC ULTRASOUND (EUS) LINEAR;  Surgeon: [name], MD;  Location: [facility];  Service: Endoscopy;  Laterality: N/A;  . LAPAROSCOPIC GASTRIC RESECTION N/A 04/23/2015   Procedure: LAPAROSCOPIC PARTIAL GASTRECTOMY;  Surgeon: Stark Klein, MD;  Location: [facility];  Service: General;  Laterality: N/A;  . NASAL ENDOSCOPY    . TONSILLECTOMY    . WISDOM TOOTH EXTRACTION      Current Facility-Administered Medications  Medication Dose Route  Frequency Provider Last Rate Last Dose  . 0.9 %  sodium chloride infusion   Intravenous Continuous [name], MD      . lactated ringers infusion   Intravenous Continuous [name], MD        Allergies as of 04/22/2016 - Review Complete 04/20/2016  Allergen Reaction Noted  . Adhesive [tape] Dermatitis 05/11/2015    Family History  Problem Relation Age of Onset  . Hypertension Mother   . Diabetes Mother   . Kidney disease Mother     Stage 2  . Hypertension Father   . Heart disease Father   . Colon cancer Neg Hx   . Colon polyps Neg Hx   . Esophageal cancer Neg Hx     Social History   Social History  . Marital status: Single    Spouse name: N/A  . Number of children: 0  . Years of education: N/A   Occupational History  . Delivers [employer]    Social History Main Topics  . Smoking status: Never Smoker  . Smokeless tobacco: Never Used  . Alcohol use No  . Drug use: No  . Sexual activity: Not on file   Other Topics Concern  . Not on file   Social History Narrative  . No narrative on file     Physical Exam: BP 110/80   Temp 97.9 F (36.6 C) (Oral)   Resp 13   Ht 4\' 11"  (1.499 m)   Wt 175 lb (79.4 kg)   LMP 04/26/2016   SpO2  100%   BMI 35.35 kg/m  Constitutional: generally well-appearing Psychiatric: alert and oriented x3 Abdomen: soft, nontender, nondistended, no obvious ascites, no peritoneal signs, normal bowel sounds No peripheral edema noted in lower extremities  Assessment and plan: 36 y.o. female with GIST resection last year  For upper EUS, checking for residual/recurrent disease.  Please see the "Patient Instructions" section for addition details about the plan.  Owens Loffler, MD [facility] 05/05/2016, 10:12 AM

## 2016-05-09 ENCOUNTER — Encounter (HOSPITAL_COMMUNITY): Payer: Self-pay | Admitting: Gastroenterology

## 2016-06-04 ENCOUNTER — Other Ambulatory Visit: Payer: Self-pay | Admitting: Internal Medicine

## 2016-06-13 NOTE — Addendum Note (Signed)
Addendum  created 06/13/16 1352 by Myrtie Soman, MD   Sign clinical note

## 2016-11-08 ENCOUNTER — Other Ambulatory Visit: Payer: Self-pay | Admitting: Nurse Practitioner

## 2016-11-08 ENCOUNTER — Other Ambulatory Visit: Payer: Self-pay | Admitting: Family Medicine

## 2016-11-08 DIAGNOSIS — L719 Rosacea, unspecified: Secondary | ICD-10-CM

## 2016-11-15 ENCOUNTER — Ambulatory Visit (INDEPENDENT_AMBULATORY_CARE_PROVIDER_SITE_OTHER): Payer: Medicare Other

## 2016-11-15 DIAGNOSIS — Z23 Encounter for immunization: Secondary | ICD-10-CM

## 2017-01-12 ENCOUNTER — Ambulatory Visit (INDEPENDENT_AMBULATORY_CARE_PROVIDER_SITE_OTHER): Payer: Medicare Other | Admitting: Family Medicine

## 2017-01-12 ENCOUNTER — Encounter: Payer: Self-pay | Admitting: Family Medicine

## 2017-01-12 VITALS — BP 132/87 | HR 86 | Temp 98.4°F | Ht 59.0 in | Wt 178.0 lb

## 2017-01-12 DIAGNOSIS — H60332 Swimmer's ear, left ear: Secondary | ICD-10-CM | POA: Diagnosis not present

## 2017-01-12 DIAGNOSIS — R300 Dysuria: Secondary | ICD-10-CM | POA: Diagnosis not present

## 2017-01-12 DIAGNOSIS — H66005 Acute suppurative otitis media without spontaneous rupture of ear drum, recurrent, left ear: Secondary | ICD-10-CM

## 2017-01-12 MED ORDER — CIPROFLOXACIN HCL 500 MG PO TABS: 500.0000 mg | ORAL_TABLET | Freq: Two times a day (BID) | ORAL | 0 refills | Status: DC

## 2017-01-12 MED ORDER — CIPROFLOXACIN-DEXAMETHASONE 0.3-0.1 % OT SUSP: 4.0000 [drp] | Freq: Two times a day (BID) | OTIC | 0 refills | Status: DC

## 2017-01-12 NOTE — Progress Notes (Signed)
BP 132/87   Pulse 86   Temp 98.4 F (36.9 C) (Oral)   Ht 4\' 11"  (1.499 m)   Wt 178 lb (80.7 kg)   BMI 35.95 kg/m    Subjective:    Patient ID: Sara Mcgee, female    DOB: 1980/06/20, 37 y.o.   MRN: [id number]  HPI: Sara Mcgee is a 37 y.o. female presenting on 01/12/2017 for Left ear pain and bleeding (Used expired Ciprodex) and Urinary Tract Infection (burning with urination, back pain)   HPI Left ear pain Patient has been having left ear pain that is been going on for the past 2 days.  Mother is here with her as the patient does have Down syndrome and gives a lot of the history.  She says there has been some discharge and is been bloody over the past couple days and she has had a lot of swelling.  She has had recurrent issues with her ears.  They deny her having any fevers or chills or shortness of breath or wheezing.  Dysuria Patient has been having urinary frequency and some burning with urination and low back pain that is been going on over the past week.  She frequently gets the symptoms similar to this when she gets a urinary tract infection.  Relevant past medical, surgical, family and social history reviewed and updated as indicated. Interim medical history since our last visit reviewed. Allergies and medications reviewed and updated.  Review of Systems  Constitutional: Negative for chills and fever.  HENT: Positive for ear discharge and ear pain. Negative for congestion, sinus pressure, sinus pain, sneezing and tinnitus.   Eyes: Negative for redness and visual disturbance.  Respiratory: Negative for chest tightness and shortness of breath.   Cardiovascular: Negative for chest pain and leg swelling.  Genitourinary: Positive for dysuria, frequency and urgency. Negative for decreased urine volume, difficulty urinating and hematuria.  Musculoskeletal: Negative for back pain and gait problem.  Skin: Negative for rash.  Neurological: Negative for light-headedness and  headaches.  Psychiatric/Behavioral: Negative for agitation and behavioral problems.  All other systems reviewed and are negative.   Per HPI unless specifically indicated above        Objective:    BP 132/87   Pulse 86   Temp 98.4 F (36.9 C) (Oral)   Ht 4\' 11"  (1.499 m)   Wt 178 lb (80.7 kg)   BMI 35.95 kg/m   Wt Readings from Last 3 Encounters:  01/12/17 178 lb (80.7 kg)  05/05/16 175 lb (79.4 kg)  04/07/16 176 lb (79.8 kg)    Physical Exam  Constitutional: She is oriented to person, place, and time. She appears well-developed and well-nourished. No distress.  HENT:  Left Ear: There is drainage (Unable to visualize tympanic membrane because of swelling and drainage), swelling and tenderness.  Eyes: Conjunctivae are normal.  Cardiovascular: Normal rate, regular rhythm, normal heart sounds and intact distal pulses.  No murmur heard. Pulmonary/Chest: Effort normal and breath sounds normal. No respiratory distress. She has no wheezes. She has no rales.  Abdominal: Soft. Bowel sounds are normal. She exhibits no distension. There is no tenderness. There is no rebound and no guarding.  Musculoskeletal: Normal range of motion. She exhibits no edema or tenderness.  Neurological: She is alert and oriented to person, place, and time. Coordination normal.  Skin: Skin is warm and dry. No rash noted. She is not diaphoretic.  Psychiatric: She has a normal mood and affect. Her behavior is  normal.  Nursing note and vitals reviewed.       Assessment & Plan:   Problem List Items Addressed This Visit    None    Visit Diagnoses    Acute swimmer's ear of left side    -  Primary   Relevant Medications   ciprofloxacin-dexamethasone (CIPRODEX) OTIC suspension   ciprofloxacin (CIPRO) 500 MG tablet   Recurrent acute suppurative otitis media without spontaneous rupture of left tympanic membrane       Difficult to see if tube is still in place because of the amount of drainage   Relevant  Medications   ciprofloxacin (CIPRO) 500 MG tablet   Dysuria       Relevant Medications   ciprofloxacin (CIPRO) 500 MG tablet   Other Relevant Orders   Urinalysis, Complete   Urine Culture       Follow up plan: Return if symptoms worsen or fail to improve.  Counseling provided for all of the vaccine components Orders Placed This Encounter  Procedures  . Urine Culture  . Urinalysis, Complete    Caryl Pina, MD [facility] 01/12/2017, 11:27 AM

## 2017-01-27 ENCOUNTER — Telehealth: Payer: Self-pay | Admitting: Internal Medicine

## 2017-01-27 MED ORDER — COLESTIPOL HCL 1 G PO TABS: 2.0000 g | ORAL_TABLET | Freq: Every day | ORAL | 0 refills | Status: DC

## 2017-01-27 NOTE — Telephone Encounter (Signed)
Rx sent to [facility] (fax [phone]). Needs office visit for additional refills.

## 2017-02-01 ENCOUNTER — Ambulatory Visit: Payer: Medicare Other

## 2017-02-02 ENCOUNTER — Other Ambulatory Visit: Payer: Self-pay | Admitting: Internal Medicine

## 2017-02-02 DIAGNOSIS — R0989 Other specified symptoms and signs involving the circulatory and respiratory systems: Secondary | ICD-10-CM

## 2017-02-09 ENCOUNTER — Telehealth: Payer: Self-pay | Admitting: Internal Medicine

## 2017-02-09 DIAGNOSIS — R0989 Other specified symptoms and signs involving the circulatory and respiratory systems: Secondary | ICD-10-CM

## 2017-02-09 MED ORDER — OMEPRAZOLE 40 MG PO CPDR: 40.0000 mg | DELAYED_RELEASE_CAPSULE | Freq: Every day | ORAL | 0 refills | Status: DC

## 2017-02-09 NOTE — Telephone Encounter (Signed)
Patient sister-in-law states she needs medication omeprazole refilled for pt. Pt scheduled for 4.3.19.

## 2017-02-09 NOTE — Telephone Encounter (Signed)
Rx refilled until 04/12/17 appointment.

## 2017-02-10 ENCOUNTER — Other Ambulatory Visit: Payer: Self-pay | Admitting: Internal Medicine

## 2017-02-15 ENCOUNTER — Ambulatory Visit (INDEPENDENT_AMBULATORY_CARE_PROVIDER_SITE_OTHER): Payer: Medicare Other | Admitting: *Deleted

## 2017-02-15 DIAGNOSIS — Z0000 Encounter for general adult medical examination without abnormal findings: Secondary | ICD-10-CM

## 2017-02-15 NOTE — Patient Instructions (Signed)
Please work on walking for exercise 2-3 times per week for 30 minutes.  Please bring Advanced Directives at your convenience to be filed in medical record.   Thank you for coming in for your Annual Wellness Visit today!!   Preventive Care 18-39 Years, Female Preventive care refers to lifestyle choices and visits with your health care provider that can promote health and wellness. What does preventive care include?  A yearly physical exam. This is also called an annual well check.  Dental exams once or twice a year.  Routine eye exams. Ask your health care provider how often you should have your eyes checked.  Personal lifestyle choices, including: ? Daily care of your teeth and gums. ? Regular physical activity. ? Eating a healthy diet. ? Avoiding tobacco and drug use. ? Limiting alcohol use. ? Practicing safe sex. ? Taking vitamin and mineral supplements as recommended by your health care provider. What happens during an annual well check? The services and screenings done by your health care provider during your annual well check will depend on your age, overall health, lifestyle risk factors, and family history of disease. Counseling Your health care provider may ask you questions about your:  Alcohol use.  Tobacco use.  Drug use.  Emotional well-being.  Home and relationship well-being.  Sexual activity.  Eating habits.  Work and work Statistician.  Method of birth control.  Menstrual cycle.  Pregnancy history.  Screening You may have the following tests or measurements:  Height, weight, and BMI.  Diabetes screening. This is done by checking your blood sugar (glucose) after you have not eaten for a while (fasting).  Blood pressure.  Lipid and cholesterol levels. These may be checked every 5 years starting at age 81.  Skin check.  Hepatitis C blood test.  Hepatitis B blood test.  Sexually transmitted disease (STD) testing.  BRCA-related cancer  screening. This may be done if you have a family history of breast, ovarian, tubal, or peritoneal cancers.  Pelvic exam and Pap test. This may be done every 3 years starting at age 40. Starting at age 20, this may be done every 5 years if you have a Pap test in combination with an HPV test.  Discuss your test results, treatment options, and if necessary, the need for more tests with your health care provider. Vaccines Your health care provider may recommend certain vaccines, such as:  Influenza vaccine. This is recommended every year.  Tetanus, diphtheria, and acellular pertussis (Tdap, Td) vaccine. You may need a Td booster every 10 years.  Varicella vaccine. You may need this if you have not been vaccinated.  HPV vaccine. If you are 72 or younger, you may need three doses over 6 months.  Measles, mumps, and rubella (MMR) vaccine. You may need at least one dose of MMR. You may also need a second dose.  Pneumococcal 13-valent conjugate (PCV13) vaccine. You may need this if you have certain conditions and were not previously vaccinated.  Pneumococcal polysaccharide (PPSV23) vaccine. You may need one or two doses if you smoke cigarettes or if you have certain conditions.  Meningococcal vaccine. One dose is recommended if you are age 19-21 years and a first-year college student living in a residence hall, or if you have one of several medical conditions. You may also need additional booster doses.  Hepatitis A vaccine. You may need this if you have certain conditions or if you travel or work in places where you may be exposed to hepatitis  A.  Hepatitis B vaccine. You may need this if you have certain conditions or if you travel or work in places where you may be exposed to hepatitis B.  Haemophilus influenzae type b (Hib) vaccine. You may need this if you have certain risk factors.  Talk to your health care provider about which screenings and vaccines you need and how often you need  them. This information is not intended to replace advice given to you by your health care provider. Make sure you discuss any questions you have with your health care provider. Document Released: 02/22/2001 Document Revised: 09/16/2015 Document Reviewed: 10/28/2014 Elsevier Interactive Patient Education  2018 Elsevier Inc.  

## 2017-02-15 NOTE — Progress Notes (Signed)
Subjective:   Sara Mcgee is a 37 y.o. female who presents for an Initial Medicare Annual Wellness Visit.  Sara Mcgee is accompanied today by her sister in law Sara Mcgee, who attends all of patient's doctor's appiontments.  Patient has Downs Syndrome, and Sara Mcgee helps her give the history.  She enjoys bowling, watching movies and coloring.  Sara Mcgee lives with her brother, sister-in-law, and nephew during the week, and she lives with her parents on the weekends.  At her brother's house, they have a cat and 3 dogs.  She feels her health this year is better than it was last year.  She reports that she has had no ER visits, hospitalizations, or surgeries in the past year.       Review of Systems    All negative today        Objective:    Today's Vitals   02/15/17 1022  BP: 125/80  Pulse: 62  Weight: 178 lb (80.7 kg)  Height: 4\' 11"  (1.499 m)  PainSc: 0-No pain   Body mass index is 35.95 kg/m.  Advanced Directives 05/05/2016 05/03/2016 04/23/2015 04/16/2015 02/26/2015 02/11/2015  Does Patient Have a Medical Advance Directive? Yes Yes Yes Yes Yes Yes  Type of [place] administrator Power of [place] of [place]  Does patient want to make changes to medical advance directive? - No - Patient declined - No - Patient declined - -  Copy of [place] in Chart? No - copy requested - Yes Yes No - copy requested -    Patient has [place].  Requested that patient's sister in law bring a copy to be filed in Sara Mcgee's medical record.       Current Medications (verified) Outpatient Encounter Medications as of 02/15/2017  Medication Sig  . ammonium lactate (AMLACTIN) 12 % cream APPLY TOPICALLY ONCE A DAY AS NEEDED FOR DRY SKIN, APPLY TO FEET DAILY AT BEDTIME AS INSTRUCTED  . colestipol (COLESTID) 1 g tablet Take 2 tablets  (2 g total) by mouth daily. MUST HAVE OFFICE VISIT FOR FURTHER REFILLS (Patient taking differently: Take 1 g by mouth daily. MUST HAVE OFFICE VISIT FOR FURTHER REFILLS)  . levothyroxine (SYNTHROID, LEVOTHROID) 88 MCG tablet TAKE ONE TABLET BY MOUTH ONE TIME DAILY  . loperamide (IMODIUM) 2 MG capsule Take 1-2 tablets by mouth 1-2 times daily (Patient taking differently: Take 2 mg by mouth daily. )  . metroNIDAZOLE (METROCREAM) 0.75 % cream Apply 1 application topically daily.  Marland Kitchen omeprazole (PRILOSEC) 40 MG capsule Take 1 capsule (40 mg total) by mouth daily.  . ciprofloxacin (CIPRO) 500 MG tablet Take 1 tablet (500 mg total) by mouth 2 (two) times daily. (Patient not taking: Reported on 02/15/2017)  . ciprofloxacin-dexamethasone (CIPRODEX) OTIC suspension Place 4 drops into the left ear 2 (two) times daily. (Patient not taking: Reported on 02/15/2017)  . [DISCONTINUED] colestipol (COLESTID) 1 g tablet TAKE 2 TABLETS (2 G TOTAL) BY MOUTH DAILY.   No facility-administered encounter medications on file as of 02/15/2017.     Allergies (verified) Adhesive [tape]   History: Past Medical History:  Diagnosis Date  . Bilateral external ear infections   . Blisters of multiple sites    on lips since scan  . Down's syndrome   . Dry skin    feet  . Dyslipidemia   . Esophageal dysmotility   .  Gallstones   . Gastrointestinal stromal tumor (GIST) of stomach ([place])   . GERD (gastroesophageal reflux disease)   . Hard of hearing   . Hepatic steatosis   . Hypothyroidism   . PONV (postoperative nausea and vomiting)    after last endo at [facility]   Past Surgical History:  Procedure Laterality Date  . CHOLECYSTECTOMY N/A 04/23/2015   Procedure: LAPAROSCOPIC CHOLECYSTECTOMY WITH INTRAOPERATIVE CHOLANGIOGRAM;  Surgeon: Stark Klein, MD;  Location: [facility];  Service: General;  Laterality: N/A;  . ENDOSCOPIC RETROGRADE CHOLANGIOPANCREATOGRAPHY (ERCP) WITH PROPOFOL N/A 02/26/2015   Procedure: ENDOSCOPIC RETROGRADE  CHOLANGIOPANCREATOGRAPHY (ERCP) WITH PROPOFOL;  Surgeon: [name], MD;  Location: [facility];  Service: Endoscopy;  Laterality: N/A;  . ESOPHAGOGASTRODUODENOSCOPY N/A 04/23/2015   Procedure: ESOPHAGOGASTRODUODENOSCOPY (EGD);  Surgeon: Stark Klein, MD;  Location: [facility];  Service: General;  Laterality: N/A;  . EUS N/A 02/26/2015   Procedure: UPPER ENDOSCOPIC ULTRASOUND (EUS) LINEAR;  Surgeon: [name], MD;  Location: [facility];  Service: Endoscopy;  Laterality: N/A;  . EUS N/A 05/05/2016   Procedure: UPPER ENDOSCOPIC ULTRASOUND (EUS) LINEAR;  Surgeon: [name], MD;  Location: [facility];  Service: Endoscopy;  Laterality: N/A;  General?  . LAPAROSCOPIC GASTRIC RESECTION N/A 04/23/2015   Procedure: LAPAROSCOPIC PARTIAL GASTRECTOMY;  Surgeon: Stark Klein, MD;  Location: [facility];  Service: General;  Laterality: N/A;  . NASAL ENDOSCOPY    . TONSILLECTOMY    . WISDOM TOOTH EXTRACTION     Family History  Problem Relation Age of Onset  . Hypertension Mother   . Diabetes Mother   . Kidney disease Mother        Stage 2  . Hypertension Father   . Heart disease Father   . Colon cancer Neg Hx   . Colon polyps Neg Hx   . Esophageal cancer Neg Hx    Social History   Socioeconomic History  . Marital status: Single    Spouse name: None  . Number of children: 0  . Years of education: None  . Highest education level: None  Social Needs  . Financial resource strain: None  . Food insecurity - worry: None  . Food insecurity - inability: None  . Transportation needs - medical: None  . Transportation needs - non-medical: None  Occupational History  . Occupation: Delivers meals on wheels  Tobacco Use  . Smoking status: Never Smoker  . Smokeless tobacco: Never Used  Substance and Sexual Activity  . Alcohol use: No    Alcohol/week: 0.0 oz  . Drug use: No  . Sexual activity: None  Other Topics Concern  . None  Social History Narrative  . None    Tobacco  Counseling Counseling given: No   Clinical Intake:     Pain Score: 0-No pain                  Activities of Daily Living In your present state of health, do you have any difficulty performing the following activities: 02/15/2017  Hearing? Y  Comment decreased hearing in left ear  Vision? N  Difficulty concentrating or making decisions? Y  Comment Down's Syndrome  Walking or climbing stairs? Y  Comment Must hold onto railing for balance  Dressing or bathing? Y  Comment needs help getting in tub, washing hair, setting temperature in shower  Doing errands, shopping? Y  Comment Sister in law takes her to all appointments   Preparing Food and eating ? N  Using the Toilet? N  In the past six months, have you accidently leaked urine? Y  Comment Only with holding urine too long  Do you have problems with loss of bowel control? N  Comment Controlled with medication Colestipol and Loperamide   Managing your Medications? Y  Comment Sister in Sports administrator finances  Managing your Finances? Y  Comment Mother manages finances  Housekeeping or managing your Housekeeping? Y  Comment Family helps with this  Some recent data might be hidden     Immunizations and Health Maintenance Immunization History  Administered Date(s) Administered  . Influenza,inj,Quad PF,6+ Mos 11/05/2012, 10/31/2014, 10/08/2015, 11/15/2016  . Influenza-Unspecified 10/10/2013   Health Maintenance Due  Topic Date Due  . HIV Screening  02/14/1996  . PAP SMEAR  02/13/2002   HIV screening and Pap smear deferred   Patient Care Team: Dettinger, Fransisca Kaufmann, MD as PCP - General (Family Medicine) Pyrtle, Lajuan Lines, MD as Consulting Physician (Gastroenterology)      Assessment:   This is a routine wellness examination for [employer].  Hearing/Vision screen Decreased hearing in left ear per patient and her sister in law.  No hearing deficit noted during visit. No vision problems, does not wear  glasses.   Dietary issues and exercise activities discussed: Current Exercise Habits: Home exercise routine, Type of exercise: stretching;Other - see comments(Dancing or bowling), Time (Minutes): 25, Frequency (Times/Week): 2, Weekly Exercise (Minutes/Week): 50, Intensity: Mild  Goals    . Exercise 2-3x per week (30 min per time)     Increase walking to 30 minutes 2-3 times per week.     Patient and her family are working on developing healthier eating habits.  Including more vegetables, fruits, and lean proteins to meals.  Per patient's sister in law Sara Mcgee has trouble with portion control, and this is something they are working on.      Depression Screen PHQ 2/9 Scores 02/15/2017 01/12/2017 02/11/2016 10/08/2015 02/23/2015 03/05/2014 11/05/2012  PHQ - 2 Score 0 0 0 0 0 0 0    Fall Risk Fall Risk  02/15/2017 02/11/2016 02/23/2015 03/05/2014 11/05/2012  Falls in the past year? No No No No No    Is the patient's home free of loose throw rugs in walkways, pet beds, electrical cords, etc?   yes      Grab bars in the bathroom? no      Handrails on the stairs?   no      Adequate lighting?   yes     Cognitive Function: MMSE - Mini Mental State Exam 02/15/2017  Not completed: Unable to complete        Screening Tests Health Maintenance  Topic Date Due  . HIV Screening  02/14/1996  . PAP SMEAR  02/13/2002  . TETANUS/TDAP  11/06/2022  . INFLUENZA VACCINE  Completed   HIV screening and pap smear deferred    Qualifies for Shingles Vaccine?  No  Cancer Screenings: Lung: Low Dose CT Chest recommended if Age 51-80 years, 30 pack-year currently smoking OR have quit w/in 15years. Patient does not qualify. Breast: Up to date on Mammogram? Not applicable  Up to date of Bone Density/Dexa? Not applicable Colorectal: Not applicable        Plan:     Work on walking for exercise 2-3 times per week for 30 minutes. Bring Advanced Directives at your convenience to be filed in medical  record.      I have personally reviewed and noted the following in the patient's chart:   . Medical  and social history . Use of alcohol, tobacco or illicit drugs  . Current medications and supplements . Functional ability and status . Nutritional status . Physical activity . Advanced directives . List of other physicians . Hospitalizations, surgeries, and ER visits in previous 12 months . Vitals . Screenings to include cognitive, depression, and falls . Referrals and appointments  In addition, I have reviewed and discussed with patient certain preventive protocols, quality metrics, and best practice recommendations. A written personalized care plan for preventive services as well as general preventive health recommendations were provided to patient.     Denyce Robert, RN   02/15/2017

## 2017-02-16 ENCOUNTER — Encounter: Payer: Medicare Other | Admitting: Family Medicine

## 2017-02-16 ENCOUNTER — Telehealth: Payer: Self-pay | Admitting: Family Medicine

## 2017-02-16 NOTE — Telephone Encounter (Signed)
Jan talked to mom

## 2017-02-17 ENCOUNTER — Other Ambulatory Visit: Payer: Self-pay | Admitting: Internal Medicine

## 2017-04-12 ENCOUNTER — Ambulatory Visit: Payer: Medicare Other | Admitting: Internal Medicine

## 2017-04-12 ENCOUNTER — Ambulatory Visit (INDEPENDENT_AMBULATORY_CARE_PROVIDER_SITE_OTHER): Payer: Medicare Other | Admitting: Internal Medicine

## 2017-04-12 ENCOUNTER — Encounter: Payer: Self-pay | Admitting: Internal Medicine

## 2017-04-12 VITALS — HR 60 | Ht 59.0 in | Wt 177.0 lb

## 2017-04-12 DIAGNOSIS — R0989 Other specified symptoms and signs involving the circulatory and respiratory systems: Secondary | ICD-10-CM

## 2017-04-12 DIAGNOSIS — F458 Other somatoform disorders: Secondary | ICD-10-CM

## 2017-04-12 DIAGNOSIS — C49A2 Gastrointestinal stromal tumor of stomach: Secondary | ICD-10-CM | POA: Diagnosis not present

## 2017-04-12 DIAGNOSIS — K529 Noninfective gastroenteritis and colitis, unspecified: Secondary | ICD-10-CM | POA: Diagnosis not present

## 2017-04-12 DIAGNOSIS — K219 Gastro-esophageal reflux disease without esophagitis: Secondary | ICD-10-CM | POA: Diagnosis not present

## 2017-04-12 MED ORDER — COLESTIPOL HCL 1 G PO TABS: 2.0000 g | ORAL_TABLET | Freq: Every day | ORAL | 3 refills | Status: DC

## 2017-04-12 MED ORDER — OMEPRAZOLE 40 MG PO CPDR: 40.0000 mg | DELAYED_RELEASE_CAPSULE | Freq: Every day | ORAL | 1 refills | Status: DC

## 2017-04-12 NOTE — Patient Instructions (Addendum)
If you are age 36 or older, your body mass index should be between 23-30. Your Body mass index is 35.75 kg/m. If this is out of the aforementioned range listed, please consider follow up with your Primary Care Provider.  If you are age 83 or younger, your body mass index should be between 19-25. Your Body mass index is 35.75 kg/m. If this is out of the aformentioned range listed, please consider follow up with your Primary Care Provider.   Continue taking your Omeprazole 40mg  daily.  Stop taking your Imodium. May increase Colestipol to 2g daily if needed.  We will be in contact with you regarding scheduling endoscopic ultrasound (EUS) with Dr Ardis Hughs.  Thank you for choosing [facility] Dr.Pyrtle

## 2017-04-12 NOTE — Progress Notes (Signed)
Subjective:    Patient ID: Sara Mcgee, female    DOB: [birth year], 37 y.o.   MRN: [id number]  HPI Sara Mcgee is a 37 year old female with a history of Down syndrome, history of proximal stomach GIST status post resection  with suggestion of recurrence by EUS 1 year ago, history of gallstones and CBD stone status post ERCP with stone extraction followed by cholecystectomy, history of chronic loose stools and GERD who is here for follow-up.  She is here today with her sister-in-law.  She was last seen about 1 year ago when Dr. Ardis Hughs performed a follow-up EUS in the outpatient hospital setting.  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  Cytology was negative for spindle cells.  There were no enlarged perigastric lymph nodes.  She reports that she has been doing well.  Her sister-in-law states that her loose stools have improved significantly with colestipol 1 g daily and Imodium 2 mg daily.  She is having a bowel movement once daily to every other day.  No blood in her stool or melena.  No abdominal pain.  GERD and reflux is well controlled with omeprazole 40 mg daily.  She denies trouble swallowing, nausea and vomiting.  She does report pain in her right upper back which is worse with movement.  Review of Systems As per HPI, otherwise negative  Current Medications, Allergies, Past Medical History, Past Surgical History, Family History and Social History were reviewed in [employer] record.     Objective:   Physical Exam Pulse 60   Ht 4\' 11"  (1.499 m)   Wt 177 lb (80.3 kg)   LMP 03/27/2017 (Approximate)   BMI 35.75 kg/m  Constitutional: Well-developed and well-nourished. No distress. HEENT: Normocephalic and atraumatic.  Conjunctivae are normal.  No scleral icterus. Neck: Neck supple. Trachea midline. Cardiovascular: Normal rate, regular rhythm and intact distal  pulses. No M/R/G Pulmonary/chest: Effort normal and breath sounds normal. No wheezing, rales or rhonchi. Abdominal: Soft, obese, nontender, nondistended. Bowel sounds active throughout. Extremities: no clubbing, cyanosis, or edema MSK: Mild tenderness with palpation in the right upper back over the rib cage with no lesions visible Neurological: Alert and oriented to person place and time. Skin: Skin is warm and dry.  No rashes on the back Psychiatric: Normal mood and affect. Behavior is normal.      Assessment & Plan:  37 year old female with a history of Down syndrome, history of proximal stomach GIST status post resection  with suggestion of recurrence by EUS 1 year ago, history of gallstones and CBD stone status post ERCP with stone extraction followed by cholecystectomy, history of chronic loose stools and GERD who is here for follow-up.  1. GIST of proximal stomach --history of partial gastrectomy for resection of her gastric tumor, margins were possibly involved.  Repeat EUS last year showed 3.1 cm lesion which was incompletely characterized.  Dr. Ardis Hughs suggested surveillance and feel repeat EUS is appropriate.  I will ask for his opinion.  I discussed the risk, benefits and alternatives with her and her family member and she is agreeable to proceed if felt needed.  2.  GERD --well-controlled with omeprazole.  Continue omeprazole 40 mg daily  3.  Chronic loose stools --great response to colestipol and loperamide.  She is only using colestipol 1 g daily.  We would like to get her on one medication only and so the they will  try to omit the loperamide.  If loose stools or urgency return she can increase to 2 g of colestipol daily.  I did reassure her that if loperamide as needed it can be used safely.  Annual follow-up with me 25 minutes spent with the patient today. Greater than 50% was spent in counseling and coordination of care with the patient

## 2017-04-12 NOTE — H&P (View-Only) (Signed)
Subjective:    Patient ID: Sara Mcgee, female    DOB: 1980/02/19, 37 y.o.   MRN: [id number]  HPI Sara Mcgee is a 37 year old female with a history of Down syndrome, history of proximal stomach GIST status post resection  with suggestion of recurrence by EUS 1 year ago, history of gallstones and CBD stone status post ERCP with stone extraction followed by cholecystectomy, history of chronic loose stools and GERD who is here for follow-up.  She is here today with her sister-in-law.  She was last seen about 1 year ago when Dr. Ardis Hughs performed a follow-up EUS in the outpatient hospital setting.  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  Cytology was negative for spindle cells.  There were no enlarged perigastric lymph nodes.  She reports that she has been doing well.  Her sister-in-law states that her loose stools have improved significantly with colestipol 1 g daily and Imodium 2 mg daily.  She is having a bowel movement once daily to every other day.  No blood in her stool or melena.  No abdominal pain.  GERD and reflux is well controlled with omeprazole 40 mg daily.  She denies trouble swallowing, nausea and vomiting.  She does report pain in her right upper back which is worse with movement.  Review of Systems As per HPI, otherwise negative  Current Medications, Allergies, Past Medical History, Past Surgical History, Family History and Social History were reviewed in [employer] record.     Objective:   Physical Exam Pulse 60   Ht 4\' 11"  (1.499 m)   Wt 177 lb (80.3 kg)   LMP 03/27/2017 (Approximate)   BMI 35.75 kg/m  Constitutional: Well-developed and well-nourished. No distress. HEENT: Normocephalic and atraumatic.  Conjunctivae are normal.  No scleral icterus. Neck: Neck supple. Trachea midline. Cardiovascular: Normal rate, regular rhythm and intact distal  pulses. No M/R/G Pulmonary/chest: Effort normal and breath sounds normal. No wheezing, rales or rhonchi. Abdominal: Soft, obese, nontender, nondistended. Bowel sounds active throughout. Extremities: no clubbing, cyanosis, or edema MSK: Mild tenderness with palpation in the right upper back over the rib cage with no lesions visible Neurological: Alert and oriented to person place and time. Skin: Skin is warm and dry.  No rashes on the back Psychiatric: Normal mood and affect. Behavior is normal.      Assessment & Plan:  37 year old female with a history of Down syndrome, history of proximal stomach GIST status post resection  with suggestion of recurrence by EUS 1 year ago, history of gallstones and CBD stone status post ERCP with stone extraction followed by cholecystectomy, history of chronic loose stools and GERD who is here for follow-up.  1. GIST of proximal stomach --history of partial gastrectomy for resection of her gastric tumor, margins were possibly involved.  Repeat EUS last year showed 3.1 cm lesion which was incompletely characterized.  Dr. Ardis Hughs suggested surveillance and feel repeat EUS is appropriate.  I will ask for his opinion.  I discussed the risk, benefits and alternatives with her and her family member and she is agreeable to proceed if felt needed.  2.  GERD --well-controlled with omeprazole.  Continue omeprazole 40 mg daily  3.  Chronic loose stools --great response to colestipol and loperamide.  She is only using colestipol 1 g daily.  We would like to get her on one medication only and so the they will  try to omit the loperamide.  If loose stools or urgency return she can increase to 2 g of colestipol daily.  I did reassure her that if loperamide as needed it can be used safely.  Annual follow-up with me 25 minutes spent with the patient today. Greater than 50% was spent in counseling and coordination of care with the patient

## 2017-04-13 ENCOUNTER — Telehealth: Payer: Self-pay

## 2017-04-13 NOTE — Telephone Encounter (Signed)
-----   Message from [name], MD sent at 04/13/2017  7:33 AM EDT ----- Ulice Dash, Yes, I think repeat EUS is a good idea.  I'll have Mckinze Poirier get in touch.  Jasia Hiltunen, She needs upper EUS, radial +/- linear, next available MAC Thursday with MAC sedation for gastric mass.  Thanks  DJ  ----- Message ----- From: Jerene Bears, MD Sent: 04/12/2017   6:17 PM To: [name], MD  Dan, This patient is known to you and me. Hx of GIST I think surveillance EUS is appropriate for her.  Would you look to see if you agree?  Thanks for your help JMP

## 2017-04-13 NOTE — Telephone Encounter (Signed)
Left message on machine to call back  

## 2017-04-17 ENCOUNTER — Other Ambulatory Visit: Payer: Self-pay

## 2017-04-17 DIAGNOSIS — K3189 Other diseases of stomach and duodenum: Secondary | ICD-10-CM

## 2017-04-17 NOTE — Telephone Encounter (Signed)
Left message on machine to call back  EUS 04/27/17 730 am [facility] arrive at 6 am NPO midnight

## 2017-04-17 NOTE — Telephone Encounter (Signed)
EUS scheduled, pt instructed and medications reviewed.  Patient instructions left at the front desk for caregiver to p/u. Patient to call with any questions or concerns.

## 2017-04-19 ENCOUNTER — Telehealth: Payer: Self-pay | Admitting: Internal Medicine

## 2017-04-19 NOTE — Telephone Encounter (Signed)
Left voicemail that  [employer]' Order with Dr Vena Rua signature has been placed up front indicating that he does prescribe patient with colestipol 1 gram twice daily.

## 2017-04-21 ENCOUNTER — Other Ambulatory Visit: Payer: Self-pay

## 2017-04-21 ENCOUNTER — Encounter (HOSPITAL_COMMUNITY): Payer: Self-pay

## 2017-04-21 ENCOUNTER — Other Ambulatory Visit: Payer: Self-pay | Admitting: Nurse Practitioner

## 2017-04-26 NOTE — Anesthesia Preprocedure Evaluation (Addendum)
Anesthesia Evaluation  Patient identified by MRN, date of birth, ID band Patient awake    Reviewed: Allergy & Precautions, NPO status , Patient's Chart, lab work & pertinent test results  History of Anesthesia Complications (+) PONV  Airway Mallampati: I  TM Distance: >3 FB Neck ROM: Full    Dental  (+) Dental Advisory Given   Pulmonary neg pulmonary ROS,    breath sounds clear to auscultation       Cardiovascular negative cardio ROS   Rhythm:Regular Rate:Normal     Neuro/Psych 2014 flex/ex C spine OK    GI/Hepatic Neg liver ROS, GERD  Medicated and Controlled,Gastric stromal tumor   Endo/Other  Hypothyroidism Morbid obesity  Renal/GU negative Renal ROS     Musculoskeletal   Abdominal (+) + obese,   Peds  Hematology negative hematology ROS (+)   Anesthesia Other Findings Down's syndrome  Reproductive/Obstetrics LMP 2-3 weeks ago                            Anesthesia Physical Anesthesia Plan  ASA: III  Anesthesia Plan: MAC   Post-op Pain Management:    Induction:   PONV Risk Score and Plan: 3 and Ondansetron, Dexamethasone and Treatment may vary due to age or medical condition  Airway Management Planned: Natural Airway and Nasal Cannula  Additional Equipment:   Intra-op Plan:   Post-operative Plan:   Informed Consent: I have reviewed the patients History and Physical, chart, labs and discussed the procedure including the risks, benefits and alternatives for the proposed anesthesia with the patient or authorized representative who has indicated his/her understanding and acceptance.   Dental advisory given and Consent reviewed with POA  Plan Discussed with: Surgeon and CRNA  Anesthesia Plan Comments: (Plan routine monitors, MAC )        Anesthesia Quick Evaluation

## 2017-04-27 ENCOUNTER — Ambulatory Visit (HOSPITAL_COMMUNITY): Payer: Medicare Other | Admitting: Anesthesiology

## 2017-04-27 ENCOUNTER — Encounter (HOSPITAL_COMMUNITY)
Admission: RE | Disposition: A | Discharge status: Home / Self Care | Payer: Self-pay | Source: Ambulatory Visit | Attending: Gastroenterology

## 2017-04-27 ENCOUNTER — Ambulatory Visit (HOSPITAL_COMMUNITY)
Admission: RE | Admit: 2017-04-27 | Discharge: 2017-04-27 | Disposition: A | Discharge status: Home / Self Care | Payer: Medicare Other | Source: Ambulatory Visit | Attending: Gastroenterology | Admitting: Gastroenterology

## 2017-04-27 ENCOUNTER — Ambulatory Visit: Payer: Medicare Other | Admitting: Family Medicine

## 2017-04-27 ENCOUNTER — Encounter (HOSPITAL_COMMUNITY): Payer: Self-pay | Admitting: *Deleted

## 2017-04-27 ENCOUNTER — Other Ambulatory Visit: Payer: Self-pay

## 2017-04-27 DIAGNOSIS — Z8719 Personal history of other diseases of the digestive system: Secondary | ICD-10-CM | POA: Insufficient documentation

## 2017-04-27 DIAGNOSIS — Q909 Down syndrome, unspecified: Secondary | ICD-10-CM | POA: Diagnosis not present

## 2017-04-27 DIAGNOSIS — Z79899 Other long term (current) drug therapy: Secondary | ICD-10-CM | POA: Diagnosis not present

## 2017-04-27 DIAGNOSIS — R194 Change in bowel habit: Secondary | ICD-10-CM | POA: Diagnosis not present

## 2017-04-27 DIAGNOSIS — E039 Hypothyroidism, unspecified: Secondary | ICD-10-CM | POA: Insufficient documentation

## 2017-04-27 DIAGNOSIS — Z6835 Body mass index (BMI) 35.0-35.9, adult: Secondary | ICD-10-CM | POA: Insufficient documentation

## 2017-04-27 DIAGNOSIS — K219 Gastro-esophageal reflux disease without esophagitis: Secondary | ICD-10-CM | POA: Insufficient documentation

## 2017-04-27 DIAGNOSIS — Z888 Allergy status to other drugs, medicaments and biological substances status: Secondary | ICD-10-CM | POA: Diagnosis not present

## 2017-04-27 DIAGNOSIS — K3189 Other diseases of stomach and duodenum: Secondary | ICD-10-CM | POA: Insufficient documentation

## 2017-04-27 DIAGNOSIS — Z09 Encounter for follow-up examination after completed treatment for conditions other than malignant neoplasm: Secondary | ICD-10-CM | POA: Diagnosis present

## 2017-04-27 HISTORY — DX: Anxiety disorder, unspecified: F41.9

## 2017-04-27 HISTORY — PX: EUS: SHX5427

## 2017-04-27 SURGERY — UPPER ENDOSCOPIC ULTRASOUND (EUS) RADIAL
Anesthesia: Monitor Anesthesia Care

## 2017-04-27 MED ORDER — SODIUM CHLORIDE 0.9 % IV SOLN: INTRAVENOUS | Status: DC

## 2017-04-27 MED ORDER — LACTATED RINGERS IV SOLN
INTRAVENOUS | Status: DC
  Administered 2017-04-27: 07:00:00 via INTRAVENOUS

## 2017-04-27 MED ORDER — MIDAZOLAM HCL 2 MG/2ML IJ SOLN: 0.5000 mg | Freq: Once | INTRAMUSCULAR | Status: DC | PRN

## 2017-04-27 MED ORDER — PROPOFOL 10 MG/ML IV BOLUS
INTRAVENOUS | Status: AC
  Filled 2017-04-27: qty 40

## 2017-04-27 MED ORDER — DEXAMETHASONE SODIUM PHOSPHATE 10 MG/ML IJ SOLN
INTRAMUSCULAR | Status: DC | PRN
  Administered 2017-04-27: 10 mg via INTRAVENOUS

## 2017-04-27 MED ORDER — MEPERIDINE HCL 25 MG/ML IJ SOLN: 6.2500 mg | INTRAMUSCULAR | Status: DC | PRN

## 2017-04-27 MED ORDER — MIDAZOLAM HCL 2 MG/ML PO SYRP
ORAL_SOLUTION | ORAL | Status: AC
  Filled 2017-04-27: qty 8

## 2017-04-27 MED ORDER — ONDANSETRON HCL 4 MG/2ML IJ SOLN
INTRAMUSCULAR | Status: DC | PRN
  Administered 2017-04-27: 4 mg via INTRAVENOUS

## 2017-04-27 MED ORDER — PROMETHAZINE HCL 25 MG/ML IJ SOLN: 6.2500 mg | INTRAMUSCULAR | Status: DC | PRN

## 2017-04-27 NOTE — Transfer of Care (Signed)
Immediate Anesthesia Transfer of Care Note  Patient: Sara Mcgee  Procedure(s) Performed: Procedure(s): UPPER ENDOSCOPIC ULTRASOUND (EUS) RADIAL (N/A)  Patient Location: PACU  Anesthesia Type:MAC  Level of Consciousness:  sedated, patient cooperative and responds to stimulation  Airway & Oxygen Therapy:Patient Spontanous Breathing and Patient connected to face mask oxgen  Post-op Assessment:  Report given to PACU RN and Post -op Vital signs reviewed and stable  Post vital signs:  Reviewed and stable  Last Vitals:  Vitals:   04/27/17 0645 04/27/17 0750  BP:  121/78  Pulse: 82 74  Resp: 20 20  Temp: 36.7 C 36.9 C  SpO2: 974% 718%    Complications: No apparent anesthesia complications

## 2017-04-27 NOTE — Anesthesia Postprocedure Evaluation (Signed)
Anesthesia Post Note  Patient: Sara Mcgee  Procedure(s) Performed: UPPER ENDOSCOPIC ULTRASOUND (EUS) RADIAL (N/A )     Patient location during evaluation: Endoscopy Anesthesia Type: MAC Level of consciousness: awake and alert, oriented and patient cooperative Pain management: pain level controlled Vital Signs Assessment: post-procedure vital signs reviewed and stable Respiratory status: spontaneous breathing, nonlabored ventilation and respiratory function stable Cardiovascular status: blood pressure returned to baseline and stable Postop Assessment: no apparent nausea or vomiting Anesthetic complications: no    Last Vitals:  Vitals:   04/27/17 0810 04/27/17 0820  BP: 132/70 (!) 143/94  Pulse: 81 73  Resp: (!) 27 18  Temp:    SpO2: 98% 97%    Last Pain:  Vitals:   04/27/17 0820  TempSrc:   PainSc: 0-No pain                 Keyaan Lederman,E. Jewel Mcafee

## 2017-04-27 NOTE — Op Note (Signed)
[facility] Patient Name: Sara Mcgee Procedure Date: 04/27/2017 MRN: [id number] Attending MD: [name] , MD Date of Birth: 01-Sep-1980 CSN: [id number] Age: 37 Admit Type: Outpatient Procedure:                Upper EUS Indications:              4.7cm GIST resected from proximal stomach 2017 Dr.                            Barry Dienes, bland histologically with 'no significant                            mitotic activity' but positive margin. EUS 04/2016                            found 3.1cm trilobed soft tissue in the region of                            anatomic distortion from the previous surgery, FNA                            negative for spindle cells. Providers:                [name], MD, Vista Lawman, RN, Baird Cancer, RN, William Dalton, Technician Referring MD:             Zenovia Jarred, MD Medicines:                Monitored Anesthesia Care Complications:            No immediate complications. Estimated blood loss:                            None. Estimated Blood Loss:     Estimated blood loss: none. Procedure:                Pre-Anesthesia Assessment:                           - Prior to the procedure, a History and Physical                            was performed, and patient medications and                            allergies were reviewed. The patient's tolerance of                            previous anesthesia was also reviewed. The risks                            and benefits of the procedure and the sedation  options and risks were discussed with the patient.                            All questions were answered, and informed consent                            was obtained. Prior Anticoagulants: The patient has                            taken no previous anticoagulant or antiplatelet                            agents. ASA Grade Assessment: III - A patient with   severe systemic disease. After reviewing the risks                            and benefits, the patient was deemed in                            satisfactory condition to undergo the procedure.                           After obtaining informed consent, the endoscope was                            passed under direct vision. Throughout the                            procedure, the patient's blood pressure, pulse, and                            oxygen saturations were monitored continuously. The                            was introduced through the mouth, and advanced to                            the duodenal bulb. The upper EUS was accomplished                            without difficulty. The patient tolerated the                            procedure well. Scope In: Scope Out: Findings:      ENDOSONOGRAPHIC FINDING: :      1. In the proximal stomach at the site of obvious postoperative anatomic       distortion there was a trilobed, subepthelial, hypoechoic, heterogeneous       soft tissue lesion that measured 3.0cm across, 0.7cm thickness. This       appears to communicate with the muscularis propria layer of the proximal       gastric wall. It is essentially unchanged from one year ago.      2. No perigastric adenopathy.      3. Limited views of the liver, spleen,  pancreas, portal and splenic       vessels were all normal. Impression:               - The subepithelial soft tissue at the site of 2017                            GIST resection is unchanged in size, morphology                            from one year ago. This may represent residual GIST                            however EUS last year showed no spindle cells. Note                            that histology from the 2017 4.7cm GIST was bland                            with 'no significant mitotic activity.' I recommend                            repeat EUS in 2 years for surveillance, will                             communicate that with Drs. Pyrtle, Byerly. Moderate Sedation:      N/A- Per Anesthesia Care Recommendation:           - Discharge patient to home. Procedure Code(s):        --- Professional ---                           [phone], Esophagogastroduodenoscopy, flexible,                            transoral; with endoscopic ultrasound examination                            limited to the esophagus, stomach or duodenum, and                            adjacent structures Diagnosis Code(s):        --- Professional ---                           K31.89, Other diseases of stomach and duodenum CPT copyright 2017 American Medical Association. All rights reserved. The codes documented in this report are preliminary and upon coder review may  be revised to meet current compliance requirements. [name], MD 04/27/2017 7:57:24 AM This report has been signed electronically. Number of Addenda: 0

## 2017-04-27 NOTE — Interval H&P Note (Signed)
History and Physical Interval Note:  04/27/2017 7:11 AM  Sara Mcgee  has presented today for surgery, with the diagnosis of gastric mass  The various methods of treatment have been discussed with the patient and family. After consideration of risks, benefits and other options for treatment, the patient has consented to  Procedure(s): UPPER ENDOSCOPIC ULTRASOUND (EUS) RADIAL (N/A) as a surgical intervention .  The patient's history has been reviewed, patient examined, no change in status, stable for surgery.  I have reviewed the patient's chart and labs.  Questions were answered to the patient's satisfaction.     [name]

## 2017-04-27 NOTE — Discharge Instructions (Signed)
YOU HAD AN ENDOSCOPIC PROCEDURE TODAY: Refer to the procedure report and other information in the discharge instructions given to you for any specific questions about what was found during the examination. If this information does not answer your questions, please call [place] office at [phone] to clarify.  ° °YOU SHOULD EXPECT: Some feelings of bloating in the abdomen. Passage of more gas than usual. Walking can help get rid of the air that was put into your GI tract during the procedure and reduce the bloating.. ° °DIET: Your first meal following the procedure should be a light meal and then it is ok to progress to your normal diet. A half-sandwich or bowl of soup is an example of a good first meal. Heavy or fried foods are harder to digest and may make you feel nauseous or bloated. Drink plenty of fluids but you should avoid alcoholic beverages for 24 hours. If you had a esophageal dilation, please see attached instructions for diet.   ° °ACTIVITY: Your care partner should take you home directly after the procedure. You should plan to take it easy, moving slowly for the rest of the day. You can resume normal activity the day after the procedure however YOU SHOULD NOT DRIVE, use power tools, machinery or perform tasks that involve climbing or major physical exertion for 24 hours (because of the sedation medicines used during the test).  ° °SYMPTOMS TO REPORT IMMEDIATELY: °A gastroenterologist can be reached at any hour. Please call [phone]  for any of the following symptoms:  ° °Following upper endoscopy (EGD, EUS, ERCP, esophageal dilation) °Vomiting of blood or coffee ground material  °New, significant abdominal pain  °New, significant chest pain or pain under the shoulder blades  °Painful or persistently difficult swallowing  °New shortness of breath  °Black, tarry-looking or red, bloody stools ° °FOLLOW UP:  °If any biopsies were taken you will be contacted by phone or by letter within the next 1-3  weeks. Call [phone]  if you have not heard about the biopsies in 3 weeks.  °Please also call with any specific questions about appointments or follow up tests. ° °

## 2017-04-28 ENCOUNTER — Encounter (HOSPITAL_COMMUNITY): Payer: Self-pay | Admitting: Gastroenterology

## 2017-05-01 ENCOUNTER — Encounter: Payer: Self-pay | Admitting: Family Medicine

## 2017-05-01 ENCOUNTER — Other Ambulatory Visit: Payer: Self-pay | Admitting: Family Medicine

## 2017-05-01 ENCOUNTER — Ambulatory Visit (INDEPENDENT_AMBULATORY_CARE_PROVIDER_SITE_OTHER): Payer: Medicare Other | Admitting: Family Medicine

## 2017-05-01 VITALS — BP 129/77 | HR 63 | Temp 98.3°F | Ht 59.0 in | Wt 176.0 lb

## 2017-05-01 DIAGNOSIS — N390 Urinary tract infection, site not specified: Secondary | ICD-10-CM

## 2017-05-01 DIAGNOSIS — Q909 Down syndrome, unspecified: Secondary | ICD-10-CM | POA: Diagnosis not present

## 2017-05-01 DIAGNOSIS — E039 Hypothyroidism, unspecified: Secondary | ICD-10-CM | POA: Diagnosis not present

## 2017-05-01 DIAGNOSIS — E7800 Pure hypercholesterolemia, unspecified: Secondary | ICD-10-CM

## 2017-05-01 LAB — URINALYSIS, COMPLETE
Bilirubin, UA: NEGATIVE
GLUCOSE, UA: NEGATIVE
KETONES UA: NEGATIVE
Leukocytes, UA: NEGATIVE
NITRITE UA: NEGATIVE
Protein, UA: NEGATIVE
RBC, UA: NEGATIVE
Specific Gravity, UA: 1.015 (ref 1.005–1.030)
Urobilinogen, Ur: 0.2 mg/dL (ref 0.2–1.0)
pH, UA: 7 (ref 5.0–7.5)

## 2017-05-01 LAB — MICROSCOPIC EXAMINATION
RBC MICROSCOPIC, UA: NONE SEEN /HPF (ref 0–2)
Renal Epithel, UA: NONE SEEN /hpf
WBC, UA: NONE SEEN /hpf (ref 0–5)

## 2017-05-01 NOTE — Progress Notes (Signed)
BP 129/77   Pulse 63   Temp 98.3 F (36.8 C) (Oral)   Ht 4' 11"  (1.499 m)   Wt 176 lb (79.8 kg)   BMI 35.55 kg/m    Subjective:    Patient ID: Sara Mcgee, female    DOB: [birth year], 37 y.o.   MRN: [id number]  HPI: KEVIA ZAUCHA is a 37 y.o. female presenting on 05/01/2017 for Hypothyroidism   HPI Hypothyroidism recheck Patient is coming in for thyroid recheck today as well. They deny any issues with hair changes or heat or cold problems or diarrhea or constipation. They deny any chest pain or palpitations. They are currently on levothyroxine 72mcrograms   Hyperlipidemia Patient is coming in for recheck of his hyperlipidemia. The patient is currently taking colestipol. They deny any issues with myalgias or history of liver damage from it. They deny any focal numbness or weakness or chest pain.   Frequent UTI Patient has been having frequent urinary tract infection symptoms and does not always feel them and wants to leave a urine for recheck today.  Patient gets some occasional low back tenderness from muscles but is otherwise normal  Relevant past medical, surgical, family and social history reviewed and updated as indicated. Interim medical history since our last visit reviewed. Allergies and medications reviewed and updated.  Review of Systems  Constitutional: Negative for chills and fever.  HENT: Negative for congestion, ear discharge and ear pain.   Eyes: Negative for visual disturbance.  Respiratory: Negative for chest tightness and shortness of breath.   Cardiovascular: Negative for chest pain and leg swelling.  Gastrointestinal: Negative for abdominal pain.  Genitourinary: Negative for difficulty urinating, dysuria, frequency, urgency, vaginal bleeding, vaginal discharge and vaginal pain.  Musculoskeletal: Positive for back pain. Negative for gait problem.  Skin: Negative for rash.  Neurological: Negative for light-headedness and headaches.    Psychiatric/Behavioral: Negative for agitation and behavioral problems.  All other systems reviewed and are negative.   Per HPI unless specifically indicated above   Allergies as of 05/01/2017      Reactions   Adhesive [tape] Dermatitis, Other (See Comments)   Blistering rash from drape adhesive in OR>        Medication List        Accurate as of 05/01/17  1:25 PM. Always use your most recent med list.          ammonium lactate 12 % cream Commonly known as:  AMLACTIN APPLY TOPICALLY ONCE A DAY AS NEEDED FOR DRY SKIN, APPLY TO FEET DAILY AT BEDTIME AS INSTRUCTED   colestipol 1 g tablet Commonly known as:  COLESTID Take 2 tablets (2 g total) by mouth daily.   levothyroxine 88 MCG tablet Commonly known as:  SYNTHROID, LEVOTHROID TAKE ONE TABLET BY MOUTH ONE TIME DAILY   levothyroxine 88 MCG tablet Commonly known as:  SYNTHROID, LEVOTHROID TAKE ONE TABLET BY MOUTH ONE TIME DAILY   omeprazole 40 MG capsule Commonly known as:  PRILOSEC Take 1 capsule (40 mg total) by mouth daily.          Objective:    BP 129/77   Pulse 63   Temp 98.3 F (36.8 C) (Oral)   Ht 4' 11"  (1.499 m)   Wt 176 lb (79.8 kg)   BMI 35.55 kg/m   Wt Readings from Last 3 Encounters:  05/01/17 176 lb (79.8 kg)  04/27/17 177 lb (80.3 kg)  04/12/17 177 lb (80.3 kg)    Physical Exam  Constitutional: She is oriented to person, place, and time. She appears well-developed and well-nourished. No distress.  Eyes: Pupils are equal, round, and reactive to light. Conjunctivae and EOM are normal.  Neck: Neck supple. No thyromegaly present.  Cardiovascular: Normal rate, regular rhythm, normal heart sounds and intact distal pulses.  No murmur heard. Pulmonary/Chest: Effort normal and breath sounds normal. No respiratory distress. She has no wheezes.  Musculoskeletal: Normal range of motion. She exhibits tenderness (Right thoracic muscular tenderness). She exhibits no edema.  Lymphadenopathy:    She  has no cervical adenopathy.  Neurological: She is alert and oriented to person, place, and time. Coordination normal.  Skin: Skin is warm and dry. No rash noted. She is not diaphoretic.  Psychiatric: She has a normal mood and affect. Her behavior is normal.  Nursing note and vitals reviewed.   Urinalysis pending    Assessment & Plan:   Problem List Items Addressed This Visit      Endocrine   Hypothyroidism - Primary   Relevant Orders   TSH   CBC with Differential/Platelet   CMP14+EGFR     Other   HYPERCHOLESTEROLEMIA   Relevant Orders   CMP14+EGFR   Lipid panel   DOWN SYNDROME    Other Visit Diagnoses    Frequent UTI       Relevant Orders   Urinalysis, Complete      Continue current medications  Follow up plan: Return in about 1 year (around 05/02/2018), or if symptoms worsen or fail to improve.  Counseling provided for all of the vaccine components Orders Placed This Encounter  Procedures  . TSH  . CBC with Differential/Platelet  . CMP14+EGFR  . Lipid panel  . Urinalysis, Complete    Caryl Pina, MD [facility] 05/01/2017, 1:25 PM

## 2017-05-02 LAB — LIPID PANEL
CHOL/HDL RATIO: 4.4 ratio (ref 0.0–4.4)
CHOLESTEROL TOTAL: 171 mg/dL (ref 100–199)
HDL: 39 mg/dL — ABNORMAL LOW (ref >39)
LDL CALC: 90 mg/dL (ref 0–99)
Triglycerides: 210 mg/dL — ABNORMAL HIGH (ref 0–149)
VLDL Cholesterol Cal: 42 mg/dL — ABNORMAL HIGH (ref 5–40)

## 2017-05-02 LAB — CBC WITH DIFFERENTIAL/PLATELET
BASOS: 0 %
Basophils Absolute: 0 10*3/uL (ref 0.0–0.2)
EOS (ABSOLUTE): 0.1 10*3/uL (ref 0.0–0.4)
EOS: 1 %
HEMATOCRIT: 45.7 % (ref 34.0–46.6)
HEMOGLOBIN: 15.6 g/dL (ref 11.1–15.9)
Immature Grans (Abs): 0 10*3/uL (ref 0.0–0.1)
Immature Granulocytes: 0 %
LYMPHS ABS: 2 10*3/uL (ref 0.7–3.1)
Lymphs: 26 %
MCH: 32.6 pg (ref 26.6–33.0)
MCHC: 34.1 g/dL (ref 31.5–35.7)
MCV: 96 fL (ref 79–97)
MONOCYTES: 3 %
MONOS ABS: 0.3 10*3/uL (ref 0.1–0.9)
NEUTROS ABS: 5.3 10*3/uL (ref 1.4–7.0)
Neutrophils: 70 %
Platelets: 292 10*3/uL (ref 150–379)
RBC: 4.78 x10E6/uL (ref 3.77–5.28)
RDW: 14.4 % (ref 12.3–15.4)
WBC: 7.8 10*3/uL (ref 3.4–10.8)

## 2017-05-02 LAB — CMP14+EGFR
ALK PHOS: 84 IU/L (ref 39–117)
ALT: 23 IU/L (ref 0–32)
AST: 15 IU/L (ref 0–40)
Albumin/Globulin Ratio: 1.2 (ref 1.2–2.2)
Albumin: 3.9 g/dL (ref 3.5–5.5)
BUN / CREAT RATIO: 15 (ref 9–23)
BUN: 15 mg/dL (ref 6–20)
Bilirubin Total: 0.4 mg/dL (ref 0.0–1.2)
CO2: 26 mmol/L (ref 20–29)
CREATININE: 1.03 mg/dL — AB (ref 0.57–1.00)
Calcium: 9.4 mg/dL (ref 8.7–10.2)
Chloride: 101 mmol/L (ref 96–106)
GFR calc Af Amer: 81 mL/min/{1.73_m2} (ref >59)
GFR, EST NON AFRICAN AMERICAN: 70 mL/min/{1.73_m2} (ref >59)
GLOBULIN, TOTAL: 3.3 g/dL (ref 1.5–4.5)
Glucose: 83 mg/dL (ref 65–99)
Potassium: 5.4 mmol/L — ABNORMAL HIGH (ref 3.5–5.2)
SODIUM: 143 mmol/L (ref 134–144)
Total Protein: 7.2 g/dL (ref 6.0–8.5)

## 2017-05-02 LAB — TSH: TSH: 7.62 u[IU]/mL — ABNORMAL HIGH (ref 0.450–4.500)

## 2017-05-08 ENCOUNTER — Other Ambulatory Visit: Payer: Self-pay

## 2017-05-08 MED ORDER — LEVOTHYROXINE SODIUM 100 MCG PO TABS: 100.0000 ug | ORAL_TABLET | Freq: Every day | ORAL | 3 refills | Status: DC

## 2017-09-25 ENCOUNTER — Other Ambulatory Visit: Payer: Self-pay | Admitting: Internal Medicine

## 2017-10-26 ENCOUNTER — Other Ambulatory Visit: Payer: Self-pay | Admitting: Internal Medicine

## 2017-10-26 ENCOUNTER — Encounter: Payer: Self-pay | Admitting: Family Medicine

## 2017-10-26 ENCOUNTER — Ambulatory Visit (INDEPENDENT_AMBULATORY_CARE_PROVIDER_SITE_OTHER): Payer: Medicare Other | Admitting: Family Medicine

## 2017-10-26 VITALS — BP 135/95 | HR 66 | Temp 97.6°F | Ht 59.0 in | Wt 177.6 lb

## 2017-10-26 DIAGNOSIS — M722 Plantar fascial fibromatosis: Secondary | ICD-10-CM

## 2017-10-26 DIAGNOSIS — B372 Candidiasis of skin and nail: Secondary | ICD-10-CM | POA: Diagnosis not present

## 2017-10-26 DIAGNOSIS — M67432 Ganglion, left wrist: Secondary | ICD-10-CM | POA: Diagnosis not present

## 2017-10-26 DIAGNOSIS — Z23 Encounter for immunization: Secondary | ICD-10-CM | POA: Diagnosis not present

## 2017-10-26 DIAGNOSIS — R0989 Other specified symptoms and signs involving the circulatory and respiratory systems: Secondary | ICD-10-CM

## 2017-10-26 MED ORDER — TERBINAFINE HCL 1 % EX CREA: 1.0000 "application " | TOPICAL_CREAM | Freq: Two times a day (BID) | CUTANEOUS | 0 refills | Status: DC

## 2017-10-26 NOTE — Progress Notes (Signed)
BP (!) 135/95   Pulse 66   Temp 97.6 F (36.4 C) (Oral)   Ht 4\' 11"  (1.499 m)   Wt 177 lb 9.6 oz (80.6 kg)   BMI 35.87 kg/m    Subjective:    Patient ID: Sara Mcgee, female    DOB: [birth year], 37 y.o.   MRN: [id number]  HPI: Sara Mcgee is a 37 y.o. female presenting on 10/26/2017 for right foot pain, vaginal itching, and cyst on left wrist. Foot pain began 3 months ago with no injury. Pain is sharp and located on the heel and is worse with ambulation. Pain has been getting worse despite patient wearing supportive shoes and icing foot. She has an appointment with the podiatrist in 2 weeks for this. Sister-in-law noted the patient might need special inserts due to abnormal gait and flat feet from her Down syndrome.   Vaginal itching began 3 days ago and describes it as a tickling feeling. Patient's sister-in-law, who regularly bathes the patient, said she noticed redness on the outer labia. She tried vagisil with no relief. Patient has had yeast infections in the groin area in the past. Patient denies fever, abnormal discharge, odor, or dysuria.   Patient also complains of cyst on left wrist that has been getting worse over the last couple weeks. Pain is severe when she hits her wrist on an object. Patient had a ganglionic cyst on the right wrist that had been successfully drained.   Relevant past medical, surgical, family and social history reviewed and updated as indicated. Interim medical history since our last visit reviewed. Allergies and medications reviewed and updated.  Review of Systems  Constitutional: Negative for fever.  Respiratory: Negative for shortness of breath.   Cardiovascular: Negative for chest pain and leg swelling.  Genitourinary: Positive for vaginal pain (itching). Negative for difficulty urinating, dysuria, vaginal bleeding and vaginal discharge.  Musculoskeletal: Positive for arthralgias (right foot) and joint swelling (left wrist).  Neurological: Negative  for weakness.    Per HPI unless specifically indicated above   Allergies as of 10/26/2017      Reactions   Adhesive [tape] Dermatitis, Other (See Comments)   Blistering rash from drape adhesive in OR>        Medication List        Accurate as of 10/26/17 11:59 PM. Always use your most recent med list.          ammonium lactate 12 % cream Commonly known as:  AMLACTIN APPLY TOPICALLY ONCE A DAY AS NEEDED FOR DRY SKIN, APPLY TO FEET DAILY AT BEDTIME AS INSTRUCTED   colestipol 1 g tablet Commonly known as:  COLESTID TAKE 2 TABLETS (2 G TOTAL) BY MOUTH DAILY.   levothyroxine 100 MCG tablet Commonly known as:  SYNTHROID, LEVOTHROID Take 1 tablet (100 mcg total) by mouth daily.   omeprazole 40 MG capsule Commonly known as:  PRILOSEC TAKE 1 CAPSULE BY MOUTH EVERY DAY   terbinafine 1 % cream Commonly known as:  LAMISIL Apply 1 application topically 2 (two) times daily.          Objective:    BP (!) 135/95   Pulse 66   Temp 97.6 F (36.4 C) (Oral)   Ht 4\' 11"  (1.499 m)   Wt 177 lb 9.6 oz (80.6 kg)   BMI 35.87 kg/m   Wt Readings from Last 3 Encounters:  10/26/17 177 lb 9.6 oz (80.6 kg)  05/01/17 176 lb (79.8 kg)  04/27/17 177 lb (80.3  kg)    Physical Exam  Constitutional: She appears well-developed and well-nourished.  Cardiovascular: Normal rate, regular rhythm, normal heart sounds and intact distal pulses.  Pulmonary/Chest: Effort normal and breath sounds normal.  Genitourinary:  Genitourinary Comments: White exudate noted with mild erythema bilateral labia  Musculoskeletal: She exhibits tenderness (plantar foot, right side).  Neurological: She is alert.  Skin:  Cyst on left wrist       Assessment & Plan:   Problem List Items Addressed This Visit    None    Visit Diagnoses    Yeast dermatitis    -  Primary   Relevant Medications   terbinafine (LAMISIL AT) 1 % cream   Ganglion cyst of wrist, left       Plantar fasciitis of left foot       Need  for immunization against influenza       Relevant Orders   Flu Vaccine QUAD 36+ mos IM (Completed)      Plantar fascitis: Patient complains of 3 months of persistent right foot pain that is sharp on ambulation. Patient is wearing supportive shoes and has tried ice. She has a podiatry appointment in 3 weeks which I recommend she keep. I recommend she take NSAIDS for pain and that she try rolling bottom of foot over a frozen water bottle. If pain does not improve steroid injections might be considered.   Yeast dermatitis: Vaginal itching for 3 days with no improvement with vagisil. Will prescribe Lamisilat cream. Return if symptoms persist or worsen  Ganglion cyst, left wrist:  Persistent and worsening ganglionic cyst on the left side, painful to the touch. Drainage was attempted, but unsuccessful.  Recommended anti-inflammatories for now.  Procedure: FNA of left ganglionic cyst Prep: Iodine and alcohol swap Needle: 23 gauge needle Aspirated contents: none elicited Bleeding: minimal  Follow up plan: Return if symptoms worsen or fail to improve.  Counseling provided for all of the vaccine components Orders Placed This Encounter  Procedures  . Flu Vaccine QUAD 36+ mos IM   Patient seen and examined with cadence Kathlen Mody, PA student, agree with assessment and plan above Caryl Pina, MD [facility] 11/01/2017, 8:07 AM

## 2017-10-30 ENCOUNTER — Other Ambulatory Visit: Payer: Self-pay | Admitting: Family Medicine

## 2018-01-21 ENCOUNTER — Other Ambulatory Visit: Payer: Self-pay | Admitting: Internal Medicine

## 2018-02-19 ENCOUNTER — Encounter: Payer: Self-pay | Admitting: Family Medicine

## 2018-02-19 ENCOUNTER — Ambulatory Visit (INDEPENDENT_AMBULATORY_CARE_PROVIDER_SITE_OTHER): Payer: Medicare Other | Admitting: Family Medicine

## 2018-02-19 VITALS — BP 136/97 | HR 95 | Temp 97.0°F | Ht <= 58 in | Wt 180.2 lb

## 2018-02-19 DIAGNOSIS — E7800 Pure hypercholesterolemia, unspecified: Secondary | ICD-10-CM

## 2018-02-19 DIAGNOSIS — Q909 Down syndrome, unspecified: Secondary | ICD-10-CM

## 2018-02-19 DIAGNOSIS — E039 Hypothyroidism, unspecified: Secondary | ICD-10-CM | POA: Diagnosis not present

## 2018-02-19 NOTE — Progress Notes (Signed)
 BP (!) 136/97   Pulse 95   Temp (!) 97 F (36.1 C) (Oral)   Ht 4' 7" (1.397 m)   Wt 180 lb 3.2 oz (81.7 kg)   BMI 41.88 kg/m    Subjective:    Patient ID: Sara Mcgee, female    DOB: 02/16/1980, 38 y.o.   MRN: [id number]  HPI: Sara Mcgee is a 38 y.o. female presenting on 02/19/2018 for Annual Exam   HPI Hypothyroidism recheck Patient is coming in for thyroid recheck today as well. They deny any issues with hair changes or heat or cold problems or diarrhea or constipation. They deny any chest pain or palpitations. They are currently on levothyroxine 100 micrograms   Hyperlipidemia Patient is coming in for recheck of his hyperlipidemia. The patient is currently taking colestipol. They deny any issues with myalgias or history of liver damage from it. They deny any focal numbness or weakness or chest pain.   Down syndrome Patient has Down syndrome and has had cardiac repairs already, patient is here with her family member and they deny her having any major memory issues or appearance of Alzheimer's at this point.  Patient is coming in for annual exam and labs today and we will check her on for these things she also needs some paperwork signed for medications for assistance or aid facility.  Relevant past medical, surgical, family and social history reviewed and updated as indicated. Interim medical history since our last visit reviewed. Allergies and medications reviewed and updated.  Review of Systems  Constitutional: Negative for chills and fever.  Eyes: Negative for redness and visual disturbance.  Respiratory: Negative for chest tightness and shortness of breath.   Cardiovascular: Negative for chest pain and leg swelling.  Musculoskeletal: Negative for back pain and gait problem.  Skin: Negative for rash.  Neurological: Negative for dizziness, weakness, light-headedness, numbness and headaches.  Psychiatric/Behavioral: Negative for agitation and behavioral problems.  All  other systems reviewed and are negative.   Per HPI unless specifically indicated above   Allergies as of 02/19/2018      Reactions   Adhesive [tape] Dermatitis, Other (See Comments)   Blistering rash from drape adhesive in OR>        Medication List       Accurate as of February 19, 2018  2:15 PM. Always use your most recent med list.        ammonium lactate 12 % cream Commonly known as:  AMLACTIN APPLY TOPICALLY ONCE A DAY AS NEEDED FOR DRY SKIN, APPLY TO FEET DAILY AT BEDTIME AS INSTRUCTED   colestipol 1 g tablet Commonly known as:  COLESTID TAKE 2 TABLETS (2 G TOTAL) BY MOUTH DAILY.   levothyroxine 100 MCG tablet Commonly known as:  SYNTHROID, LEVOTHROID Take 1 tablet (100 mcg total) by mouth daily.   omeprazole 40 MG capsule Commonly known as:  PRILOSEC TAKE 1 CAPSULE BY MOUTH EVERY DAY          Objective:    BP (!) 136/97   Pulse 95   Temp (!) 97 F (36.1 C) (Oral)   Ht 4' 7" (1.397 m)   Wt 180 lb 3.2 oz (81.7 kg)   BMI 41.88 kg/m   Wt Readings from Last 3 Encounters:  02/19/18 180 lb 3.2 oz (81.7 kg)  10/26/17 177 lb 9.6 oz (80.6 kg)  05/01/17 176 lb (79.8 kg)    Physical Exam Vitals signs and nursing note reviewed.  Constitutional:        General: She is not in acute distress.    Appearance: She is well-developed. She is not diaphoretic.  Eyes:     Conjunctiva/sclera: Conjunctivae normal.     Pupils: Pupils are equal, round, and reactive to light.  Neck:     Musculoskeletal: Normal range of motion.  Cardiovascular:     Rate and Rhythm: Normal rate and regular rhythm.     Heart sounds: Normal heart sounds. No murmur.  Pulmonary:     Effort: Pulmonary effort is normal. No respiratory distress.     Breath sounds: Normal breath sounds. No wheezing.  Musculoskeletal: Normal range of motion.        General: No tenderness.  Skin:    General: Skin is warm and dry.     Findings: Rash (Rosacea on bilateral cheeks) present.  Neurological:      Mental Status: She is alert and oriented to person, place, and time.     Coordination: Coordination normal.  Psychiatric:        Behavior: Behavior normal.         Assessment & Plan:   Problem List Items Addressed This Visit      Endocrine   Hypothyroidism - Primary   Relevant Orders   CBC with Differential/Platelet   CMP14+EGFR   TSH     Other   HYPERCHOLESTEROLEMIA   Relevant Orders   Lipid panel   DOWN SYNDROME   Relevant Orders   CBC with Differential/Platelet   CMP14+EGFR       Follow up plan: Return in about 6 months (around 08/20/2018), or if symptoms worsen or fail to improve, for Thyroid recheck.  Counseling provided for all of the vaccine components Orders Placed This Encounter  Procedures  . CBC with Differential/Platelet  . CMP14+EGFR  . Lipid panel  . TSH    Joshua Dettinger, MD [facility] 02/19/2018, 2:15 PM     

## 2018-02-20 LAB — CMP14+EGFR
ALT: 24 IU/L (ref 0–32)
AST: 19 IU/L (ref 0–40)
Albumin/Globulin Ratio: 1.3 (ref 1.2–2.2)
Albumin: 4 g/dL (ref 3.8–4.8)
Alkaline Phosphatase: 84 IU/L (ref 39–117)
BUN/Creatinine Ratio: 11 (ref 9–23)
BUN: 12 mg/dL (ref 6–20)
Bilirubin Total: 0.4 mg/dL (ref 0.0–1.2)
CALCIUM: 9 mg/dL (ref 8.7–10.2)
CO2: 23 mmol/L (ref 20–29)
CREATININE: 1.07 mg/dL — AB (ref 0.57–1.00)
Chloride: 104 mmol/L (ref 96–106)
GFR calc Af Amer: 77 mL/min/{1.73_m2} (ref >59)
GFR, EST NON AFRICAN AMERICAN: 66 mL/min/{1.73_m2} (ref >59)
GLOBULIN, TOTAL: 3.1 g/dL (ref 1.5–4.5)
Glucose: 94 mg/dL (ref 65–99)
Potassium: 5 mmol/L (ref 3.5–5.2)
Sodium: 143 mmol/L (ref 134–144)
TOTAL PROTEIN: 7.1 g/dL (ref 6.0–8.5)

## 2018-02-20 LAB — CBC WITH DIFFERENTIAL/PLATELET
Basophils Absolute: 0.1 10*3/uL (ref 0.0–0.2)
Basos: 1 %
EOS (ABSOLUTE): 0 10*3/uL (ref 0.0–0.4)
Eos: 1 %
Hematocrit: 41.1 % (ref 34.0–46.6)
Hemoglobin: 13.8 g/dL (ref 11.1–15.9)
IMMATURE GRANS (ABS): 0 10*3/uL (ref 0.0–0.1)
IMMATURE GRANULOCYTES: 0 %
LYMPHS: 33 %
Lymphocytes Absolute: 1.7 10*3/uL (ref 0.7–3.1)
MCH: 31.6 pg (ref 26.6–33.0)
MCHC: 33.6 g/dL (ref 31.5–35.7)
MCV: 94 fL (ref 79–97)
MONOCYTES: 7 %
MONOS ABS: 0.3 10*3/uL (ref 0.1–0.9)
NEUTROS PCT: 58 %
Neutrophils Absolute: 3 10*3/uL (ref 1.4–7.0)
Platelets: 304 10*3/uL (ref 150–450)
RBC: 4.37 x10E6/uL (ref 3.77–5.28)
RDW: 13.4 % (ref 11.7–15.4)
WBC: 5.1 10*3/uL (ref 3.4–10.8)

## 2018-02-20 LAB — TSH: TSH: 5.89 u[IU]/mL — ABNORMAL HIGH (ref 0.450–4.500)

## 2018-02-20 LAB — LIPID PANEL
CHOL/HDL RATIO: 4 ratio (ref 0.0–4.4)
Cholesterol, Total: 164 mg/dL (ref 100–199)
HDL: 41 mg/dL (ref >39)
LDL CALC: 96 mg/dL (ref 0–99)
Triglycerides: 133 mg/dL (ref 0–149)
VLDL Cholesterol Cal: 27 mg/dL (ref 5–40)

## 2018-03-22 ENCOUNTER — Other Ambulatory Visit: Payer: Self-pay | Admitting: *Deleted

## 2018-03-22 MED ORDER — COLESTIPOL HCL 1 G PO TABS: 2.0000 g | ORAL_TABLET | Freq: Every day | ORAL | 1 refills | Status: DC

## 2018-04-10 ENCOUNTER — Other Ambulatory Visit: Payer: Medicare Other

## 2018-04-10 ENCOUNTER — Other Ambulatory Visit: Payer: Self-pay

## 2018-04-10 DIAGNOSIS — E039 Hypothyroidism, unspecified: Secondary | ICD-10-CM

## 2018-04-11 LAB — TSH: TSH: 2.17 u[IU]/mL (ref 0.450–4.500)

## 2018-04-23 ENCOUNTER — Other Ambulatory Visit: Payer: Self-pay | Admitting: Internal Medicine

## 2018-04-23 DIAGNOSIS — R0989 Other specified symptoms and signs involving the circulatory and respiratory systems: Secondary | ICD-10-CM

## 2018-04-24 ENCOUNTER — Other Ambulatory Visit: Payer: Self-pay | Admitting: Family Medicine

## 2018-04-30 ENCOUNTER — Telehealth: Payer: Self-pay | Admitting: Internal Medicine

## 2018-04-30 NOTE — Telephone Encounter (Signed)
Confirmed with patient's caregiver/aunt Genene that patient will be a telephone visit rather than in person visit on 05/08/18. She agrees to this plan.

## 2018-05-06 ENCOUNTER — Encounter: Payer: Self-pay | Admitting: Family Medicine

## 2018-05-07 ENCOUNTER — Other Ambulatory Visit: Payer: Self-pay | Admitting: *Deleted

## 2018-05-07 MED ORDER — LEVOTHYROXINE SODIUM 100 MCG PO TABS: 100.0000 ug | ORAL_TABLET | Freq: Every day | ORAL | 3 refills | Status: DC

## 2018-05-08 ENCOUNTER — Ambulatory Visit (INDEPENDENT_AMBULATORY_CARE_PROVIDER_SITE_OTHER): Payer: Medicare Other | Admitting: Internal Medicine

## 2018-05-08 ENCOUNTER — Other Ambulatory Visit: Payer: Self-pay

## 2018-05-08 ENCOUNTER — Encounter: Payer: Self-pay | Admitting: Internal Medicine

## 2018-05-08 VITALS — Ht <= 58 in | Wt 180.0 lb

## 2018-05-08 DIAGNOSIS — R0989 Other specified symptoms and signs involving the circulatory and respiratory systems: Secondary | ICD-10-CM | POA: Diagnosis not present

## 2018-05-08 DIAGNOSIS — Z8509 Personal history of malignant neoplasm of other digestive organs: Secondary | ICD-10-CM | POA: Diagnosis not present

## 2018-05-08 DIAGNOSIS — K529 Noninfective gastroenteritis and colitis, unspecified: Secondary | ICD-10-CM | POA: Diagnosis not present

## 2018-05-08 DIAGNOSIS — K219 Gastro-esophageal reflux disease without esophagitis: Secondary | ICD-10-CM | POA: Diagnosis not present

## 2018-05-08 MED ORDER — COLESTIPOL HCL 1 G PO TABS: 2.0000 g | ORAL_TABLET | Freq: Every day | ORAL | 3 refills | Status: DC

## 2018-05-08 MED ORDER — OMEPRAZOLE 40 MG PO CPDR: DELAYED_RELEASE_CAPSULE | ORAL | 3 refills | Status: DC

## 2018-05-08 NOTE — Addendum Note (Signed)
Addended by: Larina Bras on: 05/08/2018 04:42 PM   Modules accepted: Orders

## 2018-05-08 NOTE — Progress Notes (Signed)
Subjective:   This service was provided via telemedicine.  Telephone encounter The patient was located at home The provider was located in GI office The patient did consent to this telephone visit and is aware of possible charges through their insurance for this visit.   The other persons participating in this telemedicine service were patient, her aunt Regan Lemming and I Time spent on call: 9 min     Patient ID: Sara Mcgee, female    DOB: 12-Jul-1980, 38 y.o.   MRN: [id number]  HPI Sara Mcgee is a 38 year old female with a history of Down syndrome, proximal gastric GIST status post resection, history of gallstones and CBD stone status post ERCP with stone extraction followed by cholecystectomy, history of chronic loose stools and GERD who is seen for follow-up.  She is seen virtually today in the setting of COVID-19.  She was last seen 1 year ago at the time of surveillance endoscopic ultrasound performed by Dr. Ardis Hughs.  This EUS showed an unchanged trilobed subepithelial soft tissue lesion measuring 3 x 0.7 cm in the proximal stomach near the GE junction.  There was no adenopathy.  This had been biopsied previously and was found to have no spindle cells.  He recommended repeat surveillance EUS in 2 years which would be April 2021.  Recently she has been doing very well.  She is using omeprazole 40 mg daily and this is controlling her reflux and indigestion symptoms.  No dysphagia or odynophagia.  If she eats too quickly she can have some minor choking symptoms.  Loose stools have been very well controlled since increasing colestipol to 2 g daily.  She is not using loperamide.  Stools are good consistency and regular.  No diarrhea.  No constipation.  No blood in stool or melena.  Her aunt Regan Lemming says she does not complain of abdominal pain  In the last several months she had labs performed by primary care.  Her TSH was slightly elevated but after separating omeprazole from levothyroxine it  normalized.  Review of Systems As per HPI, otherwise negative  Current Medications, Allergies, Past Medical History, Past Surgical History, Family History and Social History were reviewed in [employer] record.     Objective:   Physical Exam No examination, virtual visit  CBC    Component Value Date/Time   WBC 5.1 02/19/2018 1424   WBC 5.4 04/16/2015 0949   RBC 4.37 02/19/2018 1424   RBC 4.66 04/16/2015 0949   HGB 13.8 02/19/2018 1424   HCT 41.1 02/19/2018 1424   PLT 304 02/19/2018 1424   MCV 94 02/19/2018 1424   MCH 31.6 02/19/2018 1424   MCH 34.1 (H) 04/16/2015 0949   MCHC 33.6 02/19/2018 1424   MCHC 34.9 04/16/2015 0949   RDW 13.4 02/19/2018 1424   LYMPHSABS 1.7 02/19/2018 1424   MONOABS 0.3 04/16/2015 0949   EOSABS 0.0 02/19/2018 1424   BASOSABS 0.1 02/19/2018 1424   CMP     Component Value Date/Time   NA 143 02/19/2018 1424   K 5.0 02/19/2018 1424   CL 104 02/19/2018 1424   CO2 23 02/19/2018 1424   GLUCOSE 94 02/19/2018 1424   GLUCOSE 87 04/16/2015 0949   BUN 12 02/19/2018 1424   CREATININE 1.07 (H) 02/19/2018 1424   CALCIUM 9.0 02/19/2018 1424   CALCIUM 9.2 03/20/2008 2248   PROT 7.1 02/19/2018 1424   ALBUMIN 4.0 02/19/2018 1424   AST 19 02/19/2018 1424   ALT 24 02/19/2018 1424  ALKPHOS 84 02/19/2018 1424   BILITOT 0.4 02/19/2018 1424   GFRNONAA 66 02/19/2018 1424   GFRAA 77 02/19/2018 1424   Lab Results  Component Value Date   TSH 2.170 04/10/2018       Assessment & Plan:  38 year old female with a history of Down syndrome, proximal gastric GIST status post resection, history of gallstones and CBD stone status post ERCP with stone extraction followed by cholecystectomy, history of chronic loose stools and GERD who is seen for follow-up.    1. GIST proximal stomach --status post resection with a stable 3 x 0.7 cm trilobed subepithelial lesion near the resection margin.  This has been stable now over the course of 2+ years  and on last FNA did not contain spindle cells.  Currently no symptoms. --Plan surveillance EUS with Dr. Ardis Hughs in April 2021  2.  GERD/indigestion --well-controlled with omeprazole.  Will continue omeprazole 40 mg daily.  She is separating this medication from levothyroxine --Okay to refill 90-day supply x1 year  3.  Chronic loose stools --chronic loose stools also personal history of cholecystectomy.  Loose stools were present before surgery as well.  Excellent response to colestipol.  Continue colestipol 2 g daily --Refill colestipol 2 g daily #180, refill x1 year (changing to 90-day supply)  15 minutes spent today. Greater than 50% was spent in counseling and coordination of care with the patient

## 2018-05-08 NOTE — Patient Instructions (Addendum)
You will be due for a surveillance EUS with Dr. Ardis Hughs in April 2021. We will discuss closer to that time.  Continue omeprazole 40 mg daily. Please continue separating this medication from levothyroxine.  Continue colestipol 2 g daily.

## 2018-06-19 ENCOUNTER — Encounter: Payer: Self-pay | Admitting: Family Medicine

## 2018-09-14 IMAGING — CT CT ABD-PELV W/ CM
2 of 4 series · 16 of 46 positions shown, 18 images · IV contrast (ISOVUE 300)
Comparison: 02/05/2015

CLINICAL DATA: Followup gastric GI stromal tumor. Previous partial
gastrectomy and cholecystectomy.

EXAM:
CT ABDOMEN AND PELVIS WITH CONTRAST
TECHNIQUE: Multidetector CT imaging of the abdomen and pelvis was performed
using the standard protocol following bolus administration of
intravenous contrast.
CONTRAST:  100mL DWE9P1-V99 IOPAMIDOL (DWE9P1-V99) INJECTION 61%

[Series 2: abd/pel w · axial · 0.79mm/px · z∈[-540,-126]mm · 13 of 91 slices shown, 15 images]
[im 4/91  soft-tissue]
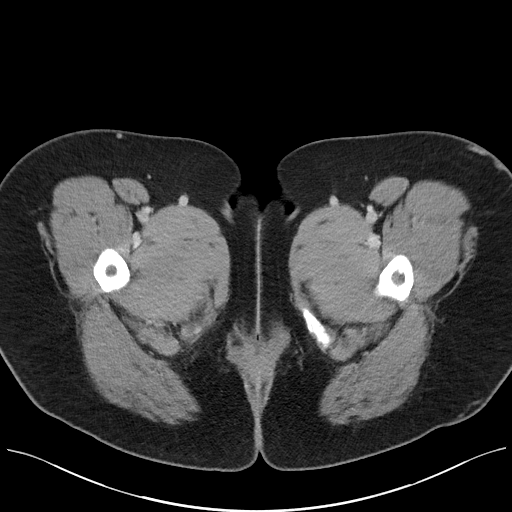
[im 4/91  bone]
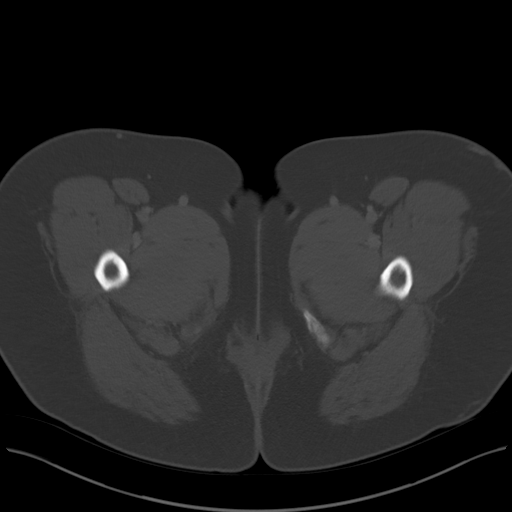
[im 11/91  soft-tissue]
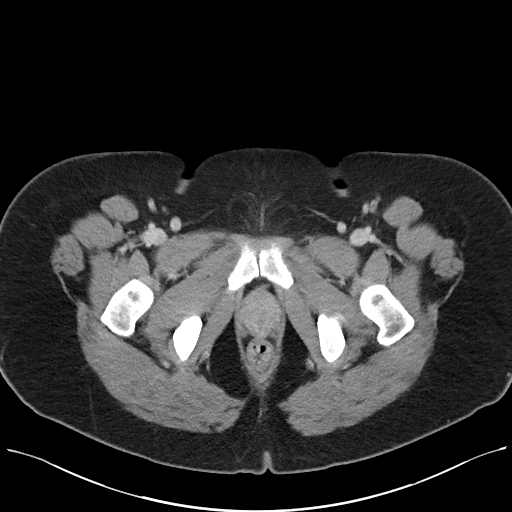
[im 19/91  soft-tissue]
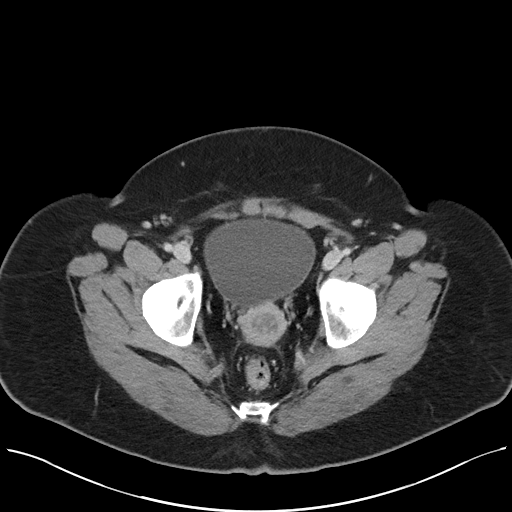
[im 26/91  soft-tissue]
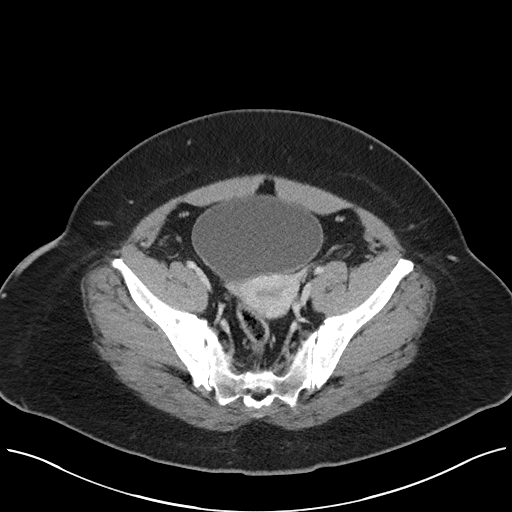
[im 33/91  soft-tissue]
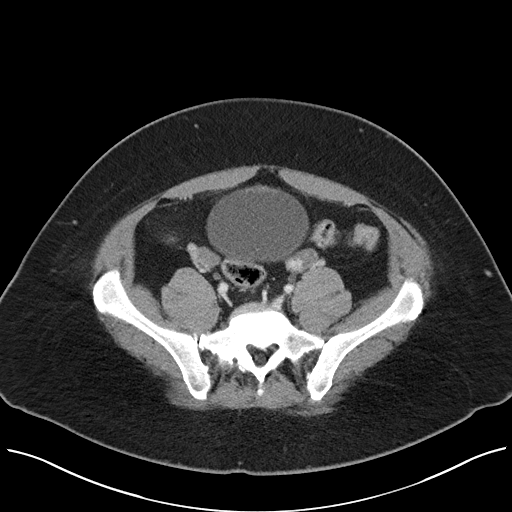
[im 40/91  soft-tissue]
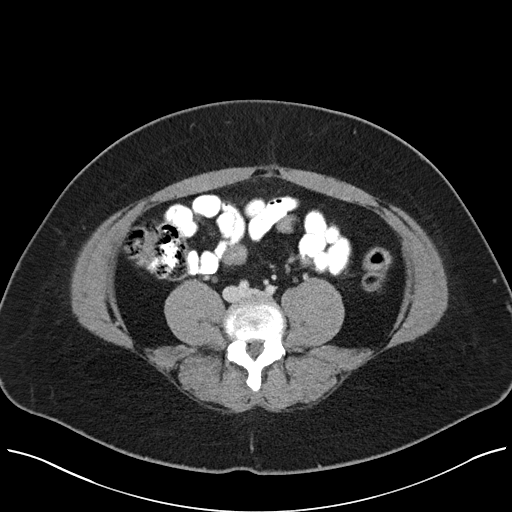
[im 47/91  soft-tissue]
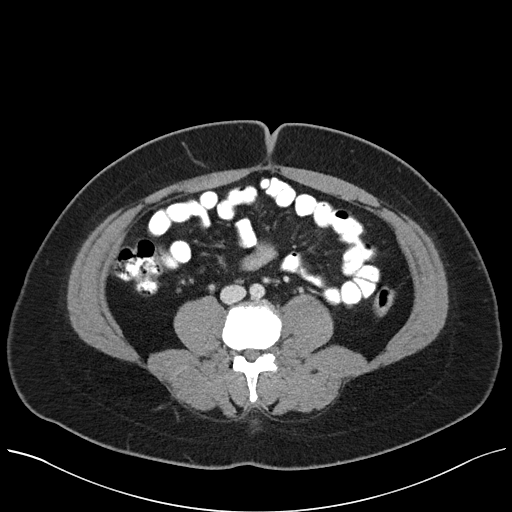
[im 51/91  soft-tissue]
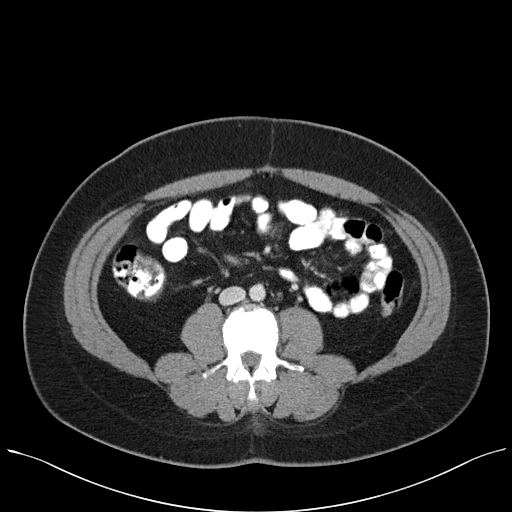
[im 58/91  soft-tissue]
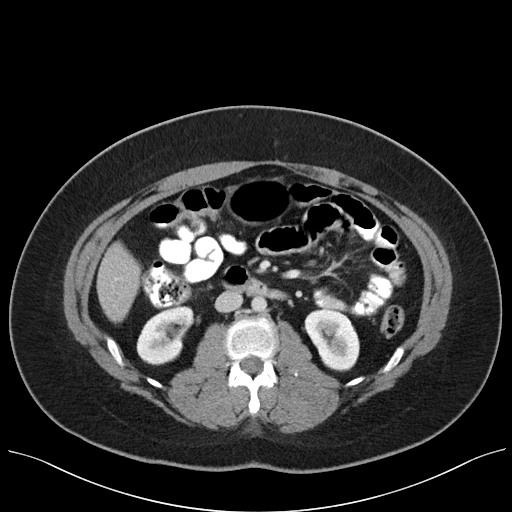
[im 58/91  bone]
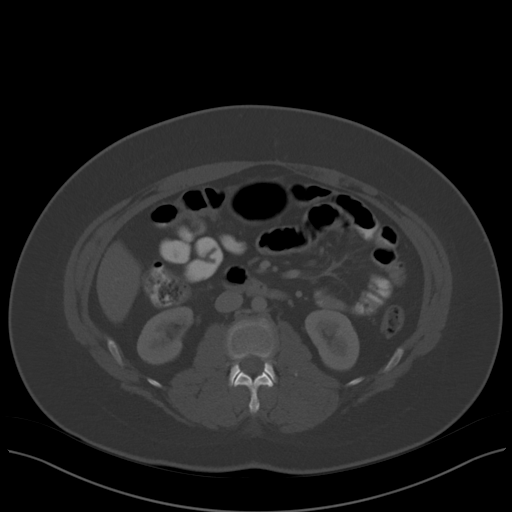
[im 65/91  soft-tissue]
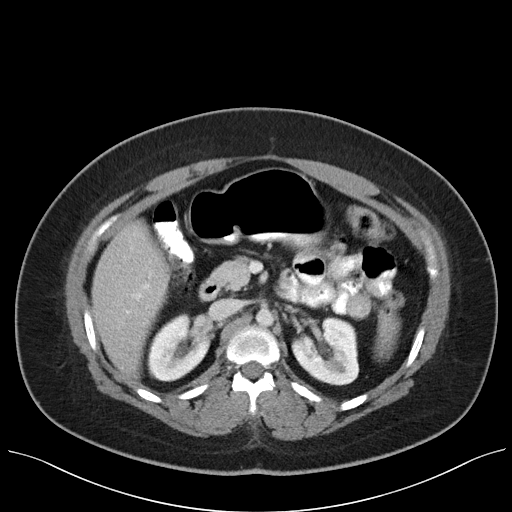
[im 73/91  soft-tissue]
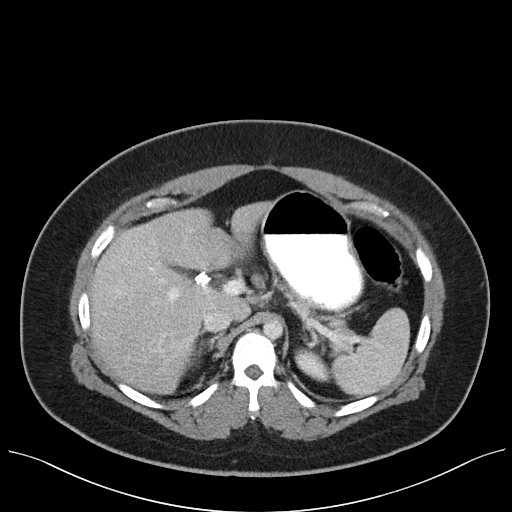
[im 80/91  soft-tissue]
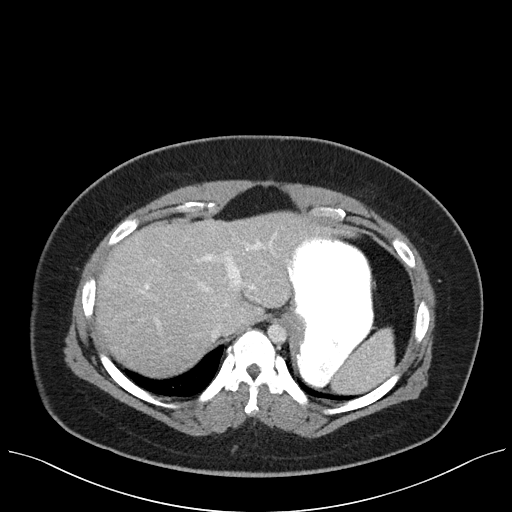
[im 87/91  soft-tissue]
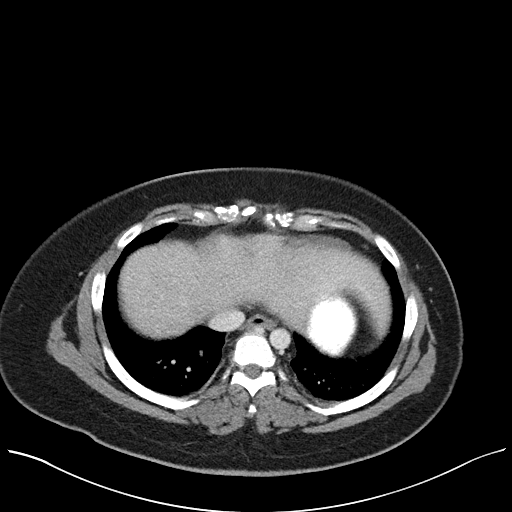

[Series 5: abd/pel w st · coronal · 0.69mm/px · 3 of 86 slices shown]
[im 29/86  soft-tissue]
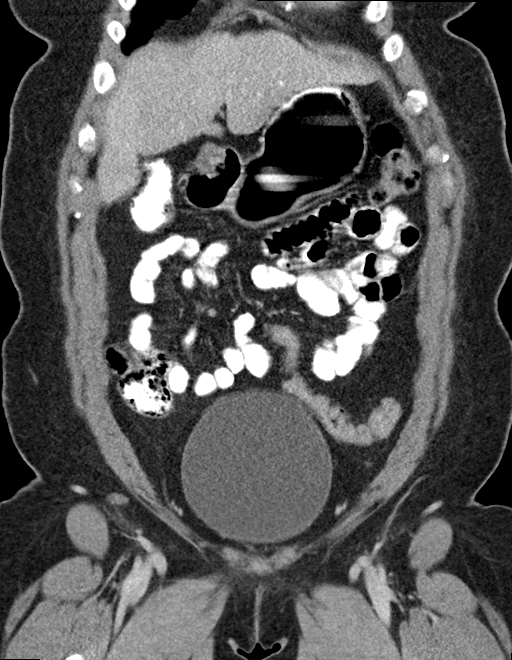
[im 38/86  soft-tissue]
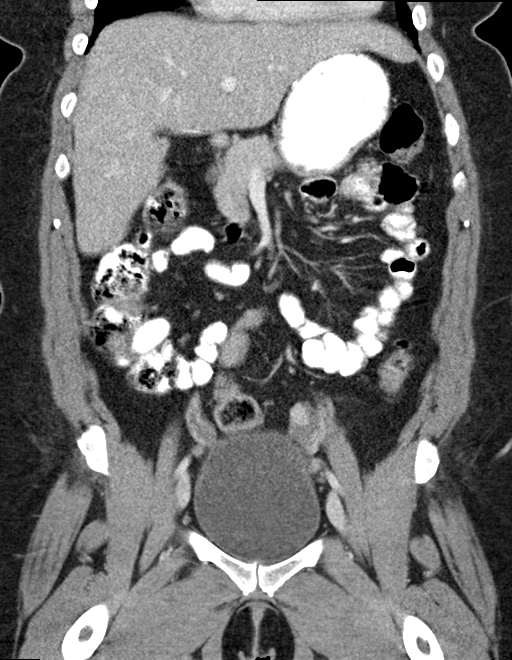
[im 48/86  soft-tissue]
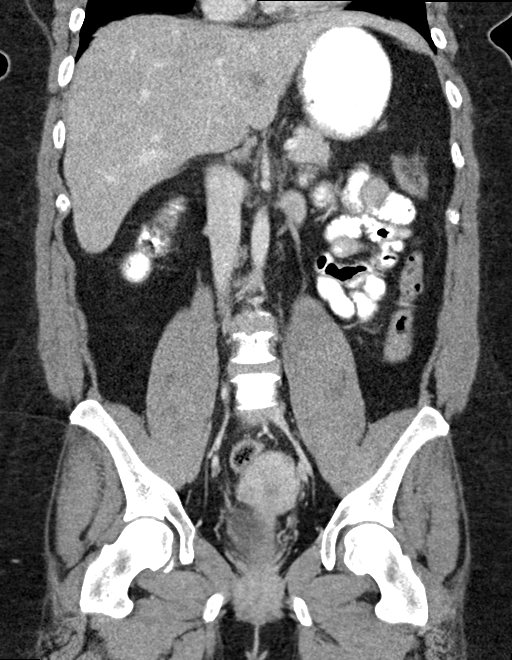

[16 of 46 positions shown; findings below may reference images not displayed]

FINDINGS: Lower Chest: No acute findings.

Hepatobiliary: No masses identified. Mild hepatic steatosis again
noted. Prior cholecystectomy noted. No evidence of biliary
dilatation.

Pancreas:  No mass or inflammatory changes.

Spleen: Within normal limits in size and appearance.

Adrenals/Urinary Tract: No masses identified. No evidence of
hydronephrosis.

Stomach/Bowel: Surgical staples are not seen along the lesser
curvature the proximal stomach. Mural nodular soft tissue density is
noted along the surgical margin in the medial wall of the proximal
stomach, and residual or recurrent neoplasm cannot be excluded.
Remainder of stomach is unremarkable. No evidence of bowel
obstruction, inflammatory process or abnormal fluid collections.

Vascular/Lymphatic: No pathologically enlarged lymph nodes. No
abdominal aortic aneurysm.

Reproductive:  No mass or other significant abnormality.

Other:  None.

Musculoskeletal:  No suspicious bone lesions identified.
IMPRESSION: Postop changes from partial gastrectomy. Mural nodular soft tissue
density along the surgical margin in the proximal stomach seen, and
residual or recurrent neoplasm cannot be excluded. Suggest upper
endoscopy for further evaluation.

No evidence of metastatic disease within the abdomen or pelvis.

Mild hepatic steatosis.

## 2018-09-24 ENCOUNTER — Telehealth: Payer: Self-pay | Admitting: Family Medicine

## 2018-09-25 NOTE — Telephone Encounter (Signed)
There is not an ENT in front of [place] anymore that I know off, we can do a referral to a new ENT, diagnosis cerumen impaction.  I do not know the closest one but we can do the referral to whichever 1 is closest for them.

## 2018-09-25 NOTE — Telephone Encounter (Signed)
Mother aware and states that she will keep patient at her doctor in [place] at this time.

## 2018-10-11 DIAGNOSIS — H6993 Unspecified Eustachian tube disorder, bilateral: Secondary | ICD-10-CM | POA: Insufficient documentation

## 2018-10-11 DIAGNOSIS — Q909 Down syndrome, unspecified: Secondary | ICD-10-CM | POA: Insufficient documentation

## 2018-10-11 DIAGNOSIS — H7292 Unspecified perforation of tympanic membrane, left ear: Secondary | ICD-10-CM | POA: Insufficient documentation

## 2018-10-11 DIAGNOSIS — H6123 Impacted cerumen, bilateral: Secondary | ICD-10-CM | POA: Insufficient documentation

## 2018-10-11 DIAGNOSIS — H90A22 Sensorineural hearing loss, unilateral, left ear, with restricted hearing on the contralateral side: Secondary | ICD-10-CM | POA: Insufficient documentation

## 2018-10-31 ENCOUNTER — Other Ambulatory Visit: Payer: Self-pay

## 2018-11-01 ENCOUNTER — Ambulatory Visit (INDEPENDENT_AMBULATORY_CARE_PROVIDER_SITE_OTHER): Payer: Medicare Other

## 2018-11-01 DIAGNOSIS — Z23 Encounter for immunization: Secondary | ICD-10-CM | POA: Diagnosis not present

## 2019-03-15 ENCOUNTER — Other Ambulatory Visit: Payer: Self-pay

## 2019-03-18 ENCOUNTER — Encounter: Payer: Self-pay | Admitting: Family Medicine

## 2019-03-18 ENCOUNTER — Other Ambulatory Visit: Payer: Self-pay

## 2019-03-18 ENCOUNTER — Telehealth: Payer: Self-pay | Admitting: Internal Medicine

## 2019-03-18 ENCOUNTER — Ambulatory Visit (INDEPENDENT_AMBULATORY_CARE_PROVIDER_SITE_OTHER): Payer: Medicare Other | Admitting: Family Medicine

## 2019-03-18 VITALS — BP 116/86 | HR 82 | Temp 99.6°F | Ht <= 58 in | Wt 181.0 lb

## 2019-03-18 DIAGNOSIS — E7800 Pure hypercholesterolemia, unspecified: Secondary | ICD-10-CM | POA: Diagnosis not present

## 2019-03-18 DIAGNOSIS — Z1289 Encounter for screening for malignant neoplasm of other sites: Secondary | ICD-10-CM

## 2019-03-18 DIAGNOSIS — E039 Hypothyroidism, unspecified: Secondary | ICD-10-CM

## 2019-03-18 NOTE — Telephone Encounter (Signed)
Left message that pt has a recall in epic for an EUS in April. Dr. Ardis Hughs will be doing the procedure and they will be contacted closer to the date for the appt to be scheduled.

## 2019-03-18 NOTE — Telephone Encounter (Signed)
Calling to schedule recall at hospital per notes with Dr. Hilarie Fredrickson.

## 2019-03-18 NOTE — Progress Notes (Signed)
BP 116/86   Pulse 82   Temp 99.6 F (37.6 C)   Ht 4' 9"  (1.448 m)   Wt 181 lb (82.1 kg)   SpO2 98%   BMI 39.17 kg/m    Subjective:   Patient ID: Sara Mcgee, female    DOB: 12/17/1980, 39 y.o.   MRN: [id number]  HPI: Sara Mcgee is a 39 y.o. female presenting on 03/18/2019 for Medical Management of Chronic Issues   HPI Hypothyroidism recheck Patient is coming in for thyroid recheck today as well. They deny any issues with hair changes or heat or cold problems or diarrhea or constipation. They deny any chest pain or palpitations. They are currently on levothyroxine 100 micrograms   Hyperlipidemia Patient is coming in for recheck of his hyperlipidemia. The patient is currently taking Colestid. They deny any issues with myalgias or history of liver damage from it. They deny any focal numbness or weakness or chest pain.   Patient's because of Down syndrome does not do pelvic exams and is doing a pelvic ultrasound instead because of this.  Relevant past medical, surgical, family and social history reviewed and updated as indicated. Interim medical history since our last visit reviewed. Allergies and medications reviewed and updated.  Review of Systems  Constitutional: Negative for chills and fever.  HENT: Negative for congestion, ear discharge, ear pain and tinnitus.   Eyes: Negative for pain, redness and visual disturbance.  Respiratory: Negative for cough, chest tightness, shortness of breath and wheezing.   Cardiovascular: Negative for chest pain, palpitations and leg swelling.  Gastrointestinal: Negative for abdominal pain, blood in stool, constipation and diarrhea.  Genitourinary: Negative for difficulty urinating, dysuria and hematuria.  Musculoskeletal: Negative for back pain, gait problem and myalgias.  Skin: Negative for rash.  Neurological: Negative for dizziness, weakness, light-headedness and headaches.  Psychiatric/Behavioral: Negative for agitation, behavioral  problems and suicidal ideas.  All other systems reviewed and are negative.   Per HPI unless specifically indicated above   Allergies as of 03/18/2019      Reactions   Adhesive [tape] Dermatitis, Other (See Comments)   Blistering rash from drape adhesive in OR>        Medication List       Accurate as of March 18, 2019 10:43 AM. If you have any questions, ask your nurse or doctor.        colestipol 1 g tablet Commonly known as: COLESTID Take 2 tablets (2 g total) by mouth daily.   levothyroxine 100 MCG tablet Commonly known as: SYNTHROID Take 1 tablet (100 mcg total) by mouth daily.   omeprazole 40 MG capsule Commonly known as: PRILOSEC TAKE 1 CAPSULE BY MOUTH EVERY DAY   urea 40 % Crea Commonly known as: CARMOL Apply topically daily.        Objective:   BP 116/86   Pulse 82   Temp 99.6 F (37.6 C)   Ht 4' 9"  (1.448 m)   Wt 181 lb (82.1 kg)   SpO2 98%   BMI 39.17 kg/m   Wt Readings from Last 3 Encounters:  03/18/19 181 lb (82.1 kg)  05/08/18 180 lb (81.6 kg)  02/19/18 180 lb 3.2 oz (81.7 kg)    Physical Exam Vitals and nursing note reviewed.  Constitutional:      General: She is not in acute distress.    Appearance: She is well-developed. She is not diaphoretic.  HENT:     Right Ear: Tympanic membrane normal.  Left Ear: Tympanic membrane normal.     Mouth/Throat:     Mouth: Mucous membranes are moist.  Eyes:     Conjunctiva/sclera: Conjunctivae normal.     Pupils: Pupils are equal, round, and reactive to light.     Comments: Strabismus in bilateral eyes, alternates  Cardiovascular:     Rate and Rhythm: Normal rate and regular rhythm.     Heart sounds: Normal heart sounds. No murmur.  Pulmonary:     Effort: Pulmonary effort is normal. No respiratory distress.     Breath sounds: Normal breath sounds. No wheezing.  Musculoskeletal:        General: No tenderness. Normal range of motion.  Skin:    General: Skin is warm and dry.     Findings:  No rash.  Neurological:     Mental Status: She is alert and oriented to person, place, and time.     Coordination: Coordination normal.  Psychiatric:        Behavior: Behavior normal.       Assessment & Plan:   Problem List Items Addressed This Visit      Endocrine   Hypothyroidism - Primary   Relevant Orders   CBC with Differential/Platelet   CMP14+EGFR   Lipid panel   TSH     Other   HYPERCHOLESTEROLEMIA   Relevant Orders   CBC with Differential/Platelet   CMP14+EGFR   Lipid panel   TSH    Other Visit Diagnoses    Encounter for pelvic screening for cancer       Relevant Orders   US Pelvis Complete       Follow up plan: Return in about 6 months (around 09/18/2019), or if symptoms worsen or fail to improve, for Thyroid and cholesterol recheck.  Counseling provided for all of the vaccine components Orders Placed This Encounter  Procedures  . US Pelvis Complete  . CBC with Differential/Platelet  . CMP14+EGFR  . Lipid panel  . TSH    Caryl Pina, MD [facility] 03/18/2019, 10:43 AM

## 2019-03-19 LAB — CMP14+EGFR
ALT: 23 IU/L (ref 0–32)
AST: 24 IU/L (ref 0–40)
Albumin/Globulin Ratio: 1.5 (ref 1.2–2.2)
Albumin: 4.2 g/dL (ref 3.8–4.8)
Alkaline Phosphatase: 90 IU/L (ref 39–117)
BUN/Creatinine Ratio: 13 (ref 9–23)
BUN: 14 mg/dL (ref 6–20)
Bilirubin Total: 0.2 mg/dL (ref 0.0–1.2)
CO2: 23 mmol/L (ref 20–29)
Calcium: 8.7 mg/dL (ref 8.7–10.2)
Chloride: 103 mmol/L (ref 96–106)
Creatinine, Ser: 1.1 mg/dL — ABNORMAL HIGH (ref 0.57–1.00)
GFR calc Af Amer: 74 mL/min/{1.73_m2} (ref >59)
GFR calc non Af Amer: 64 mL/min/{1.73_m2} (ref >59)
Globulin, Total: 2.8 g/dL (ref 1.5–4.5)
Glucose: 91 mg/dL (ref 65–99)
Potassium: 4.6 mmol/L (ref 3.5–5.2)
Sodium: 139 mmol/L (ref 134–144)
Total Protein: 7 g/dL (ref 6.0–8.5)

## 2019-03-19 LAB — CBC WITH DIFFERENTIAL/PLATELET
Basophils Absolute: 0.1 10*3/uL (ref 0.0–0.2)
Basos: 1 %
EOS (ABSOLUTE): 0.1 10*3/uL (ref 0.0–0.4)
Eos: 1 %
Hematocrit: 43 % (ref 34.0–46.6)
Hemoglobin: 13.7 g/dL (ref 11.1–15.9)
Immature Grans (Abs): 0 10*3/uL (ref 0.0–0.1)
Immature Granulocytes: 0 %
Lymphocytes Absolute: 1.6 10*3/uL (ref 0.7–3.1)
Lymphs: 28 %
MCH: 29.5 pg (ref 26.6–33.0)
MCHC: 31.9 g/dL (ref 31.5–35.7)
MCV: 93 fL (ref 79–97)
Monocytes Absolute: 0.5 10*3/uL (ref 0.1–0.9)
Monocytes: 8 %
Neutrophils Absolute: 3.6 10*3/uL (ref 1.4–7.0)
Neutrophils: 62 %
Platelets: 376 10*3/uL (ref 150–450)
RBC: 4.65 x10E6/uL (ref 3.77–5.28)
RDW: 15.3 % (ref 11.7–15.4)
WBC: 5.7 10*3/uL (ref 3.4–10.8)

## 2019-03-19 LAB — LIPID PANEL
Chol/HDL Ratio: 3.6 ratio (ref 0.0–4.4)
Cholesterol, Total: 166 mg/dL (ref 100–199)
HDL: 46 mg/dL (ref >39)
LDL Chol Calc (NIH): 100 mg/dL — ABNORMAL HIGH (ref 0–99)
Triglycerides: 111 mg/dL (ref 0–149)
VLDL Cholesterol Cal: 20 mg/dL (ref 5–40)

## 2019-03-19 LAB — TSH: TSH: 1.01 u[IU]/mL (ref 0.450–4.500)

## 2019-03-22 ENCOUNTER — Ambulatory Visit (HOSPITAL_COMMUNITY)
Admission: RE | Admit: 2019-03-22 | Discharge: 2019-03-22 | Disposition: A | Discharge status: Home / Self Care | Payer: Medicare Other | Source: Ambulatory Visit | Attending: Family Medicine | Admitting: Family Medicine

## 2019-03-22 ENCOUNTER — Other Ambulatory Visit: Payer: Self-pay

## 2019-03-22 DIAGNOSIS — Z1289 Encounter for screening for malignant neoplasm of other sites: Secondary | ICD-10-CM | POA: Diagnosis present

## 2019-03-28 ENCOUNTER — Telehealth: Payer: Self-pay

## 2019-03-28 NOTE — Telephone Encounter (Signed)
-----   Message from Timothy Lasso, RN sent at 02/12/2019 10:10 AM EST ----- Per recall pt needs an EUS for history of GIST in April 2021.

## 2019-03-28 NOTE — Telephone Encounter (Signed)
Left message on machine to call back  

## 2019-03-29 NOTE — Telephone Encounter (Signed)
Left message on machine to call back  

## 2019-03-29 NOTE — Telephone Encounter (Signed)
Will call pt to set up procedure in April next week.

## 2019-04-01 ENCOUNTER — Encounter: Payer: Self-pay | Admitting: Family Medicine

## 2019-04-01 MED ORDER — UREA 40 % EX CREA: 1.0000 "application " | TOPICAL_CREAM | Freq: Every day | CUTANEOUS | 2 refills | Status: DC

## 2019-04-05 ENCOUNTER — Ambulatory Visit (INDEPENDENT_AMBULATORY_CARE_PROVIDER_SITE_OTHER): Payer: Medicare Other

## 2019-04-05 DIAGNOSIS — Z0000 Encounter for general adult medical examination without abnormal findings: Secondary | ICD-10-CM | POA: Diagnosis not present

## 2019-04-05 NOTE — Progress Notes (Signed)
MEDICARE ANNUAL WELLNESS VISIT  04/05/2019  Telephone Visit Disclaimer This Medicare AWV was conducted by telephone due to national recommendations for restrictions regarding the COVID-19 Pandemic (e.g. social distancing).  I verified, using two identifiers, that I am speaking with Sara Mcgee or their authorized healthcare agent. I discussed the limitations, risks, security, and privacy concerns of performing an evaluation and management service by telephone and the potential availability of an in-person appointment in the future. The patient expressed understanding and agreed to proceed.   Subjective:  Sara Mcgee is a 39 y.o. female patient of Dettinger, Fransisca Kaufmann, MD who had a Medicare Annual Wellness Visit today via telephone. Sara Mcgee is legally disabled and lives with an adult companion. She has no children. She reports that she is socially active and does interact with friends/family regularly. She is minimally physically active and enjoys bowling and going to the movies.  Patient Care Team: Dettinger, Fransisca Kaufmann, MD as PCP - General (Family Medicine) Pyrtle, Lajuan Lines, MD as Consulting Physician (Gastroenterology)  Advanced Directives 04/05/2019 04/27/2017 04/21/2017 04/21/2017 02/15/2017 05/05/2016 05/03/2016  Does Patient Have a Medical Advance Directive? Yes Yes Yes Yes Yes Yes Yes  Type of Advance Directive Healthcare Power of [place]  Does patient want to make changes to medical advance directive? No - Patient declined - - No - Patient declined No - Patient declined - No - Patient declined  Copy of [place] in Chart? No - copy requested No - copy requested - No - copy requested No - copy requested No - copy requested -  Would patient like information on creating a medical advance directive? - - - No - Patient declined - - [facility] Utilization Over the Past 12 Months: # of hospitalizations or ER visits: 0 # of surgeries: 0  Review of Systems    Patient reports that her overall health is unchanged compared to last year.  History obtained from unobtainable from patient due to mental status.  Patient Reported Readings (BP, Pulse, CBG, Weight, etc) none  Pain Assessment Pain : No/denies pain     Current Medications & Allergies (verified) Allergies as of 04/05/2019      Reactions   Adhesive [tape] Dermatitis, Other (See Comments)   Blistering rash from drape adhesive in OR>        Medication List       Accurate as of April 05, 2019  9:38 AM. If you have any questions, ask your nurse or doctor.        colestipol 1 g tablet Commonly known as: COLESTID Take 2 tablets (2 g total) by mouth daily.   levothyroxine 100 MCG tablet Commonly known as: SYNTHROID Take 1 tablet (100 mcg total) by mouth daily.   omeprazole 40 MG capsule Commonly known as: PRILOSEC TAKE 1 CAPSULE BY MOUTH EVERY DAY   urea 40 % Crea Commonly known as: CARMOL Apply 1 application topically daily.       History (reviewed): Past Medical History:  Diagnosis Date  . Anxiety    needles  . Bilateral external ear infections    frequent  . Blisters of multiple sites    on lips since scan  . Down's syndrome   . Dry skin    feet  . Dyslipidemia   . Esophageal dysmotility   . Gallstones   .  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 GERD (gastroesophageal reflux disease)   . Hard of hearing   . Hepatic steatosis   . Hypothyroidism   . PONV (postoperative nausea and vomiting)    after [facility] endo at [facility]   Past Surgical History:  Procedure Laterality Date  . CHOLECYSTECTOMY N/A 04/23/2015   Procedure: LAPAROSCOPIC CHOLECYSTECTOMY WITH INTRAOPERATIVE CHOLANGIOGRAM;  Surgeon: Stark Klein, MD;  Location: [place];  Service: General;  Laterality: N/A;  . ENDOSCOPIC RETROGRADE CHOLANGIOPANCREATOGRAPHY  (ERCP) WITH PROPOFOL N/A 02/26/2015   Procedure: ENDOSCOPIC RETROGRADE CHOLANGIOPANCREATOGRAPHY (ERCP) WITH PROPOFOL;  Surgeon: [name], MD;  Location: [facility];  Service: Endoscopy;  Laterality: N/A;  . ESOPHAGOGASTRODUODENOSCOPY N/A 04/23/2015   Procedure: ESOPHAGOGASTRODUODENOSCOPY (EGD);  Surgeon: Stark Klein, MD;  Location: [place];  Service: General;  Laterality: N/A;  . EUS N/A 02/26/2015   Procedure: UPPER ENDOSCOPIC ULTRASOUND (EUS) LINEAR;  Surgeon: [name], MD;  Location: [facility];  Service: Endoscopy;  Laterality: N/A;  . EUS N/A 05/05/2016   Procedure: UPPER ENDOSCOPIC ULTRASOUND (EUS) LINEAR;  Surgeon: [name], MD;  Location: [facility];  Service: Endoscopy;  Laterality: N/A;  General?  . EUS N/A 04/27/2017   Procedure: UPPER ENDOSCOPIC ULTRASOUND (EUS) RADIAL;  Surgeon: [name], MD;  Location: [facility];  Service: Endoscopy;  Laterality: N/A;  . LAPAROSCOPIC GASTRIC RESECTION N/A 04/23/2015   Procedure: LAPAROSCOPIC PARTIAL GASTRECTOMY;  Surgeon: Stark Klein, MD;  Location: [place];  Service: General;  Laterality: N/A;  . NASAL ENDOSCOPY    . TONSILLECTOMY    . WISDOM TOOTH EXTRACTION     Family History  Problem Relation Age of Onset  . Hypertension Mother   . Diabetes Mother   . Kidney disease Mother        Stage 2  . Dementia Mother   . Hypertension Father   . Heart disease Father   . Cancer Father        lymphoma  . Diabetes Maternal Grandmother   . Colon cancer Neg Hx   . Colon polyps Neg Hx   . Esophageal cancer Neg Hx    Social History   Socioeconomic History  . Marital status: Single    Spouse name: Not on file  . Number of children: 0  . Years of education: 55  . Highest education level: 12th grade  Occupational History  . Not on file  Tobacco Use  . Smoking status: Never Smoker  . Smokeless tobacco: Never Used  Substance and Sexual Activity  . Alcohol use: No    Alcohol/week: 0.0 standard drinks  . Drug use:  No  . Sexual activity: Never  Other Topics Concern  . Not on file  Social History Narrative  . Not on file   Social Determinants of Health   Financial Resource Strain:   . Difficulty of Paying Living Expenses:   Food Insecurity:   . Worried About Charity fundraiser in the Last Year:   . Arboriculturist in the Last Year:   Transportation Needs:   . Film/video editor (Medical):   Marland Kitchen Lack of Transportation (Non-Medical):   Physical Activity:   . Days of Exercise per Week:   . Minutes of Exercise per Session:   Stress:   . Feeling of Stress :   Social Connections:   . Frequency of Communication with Friends and Family:   . Frequency of Social Gatherings with Friends and Family:   . Attends  Religious Services:   . Active Member of Clubs or Organizations:   . Attends Archivist Meetings:   Marland Kitchen Marital Status:     Activities of Daily Living In your present state of health, do you have any difficulty performing the following activities: 04/05/2019  Hearing? Y  Comment hearing loss in both ears  Vision? Y  Comment refuses to wear glasses  Difficulty concentrating or making decisions? Y  Comment downs syndrome  Walking or climbing stairs? N  Dressing or bathing? N  Doing errands, shopping? Y  Preparing Food and eating ? Y  Using the Toilet? N  In the past six months, have you accidently leaked urine? N  Do you have problems with loss of bowel control? N  Managing your Medications? Y  Managing your Finances? Y  Housekeeping or managing your Housekeeping? Y  Some recent data might be hidden    Patient Education/ Literacy How often do you need to have someone help you when you read instructions, pamphlets, or other written materials from your doctor or pharmacy?: 5 - Always What is the last grade level you completed in school?: [id number] (certificate in high school)  Exercise Current Exercise Habits: Home exercise routine, Type of exercise: exercise ball;walking,  Time (Minutes): 30, Frequency (Times/Week): 3, Weekly Exercise (Minutes/Week): 90, Intensity: Mild  Diet Patient reports consuming 3 meals a day and 1 snack(s) a day Patient reports that her primary diet is: Regular Patient reports that she does have regular access to food.   Depression Screen PHQ 2/9 Scores 03/18/2019 02/19/2018 10/26/2017 05/01/2017 02/15/2017 01/12/2017 02/11/2016  PHQ - 2 Score 0 0 0 0 0 0 0     Fall Risk Fall Risk  03/18/2019 02/15/2017 02/11/2016 02/23/2015 03/05/2014  Falls in the past year? 0 No No No No     Objective:  Claudett B Vanevery seemed alert and oriented and she participated appropriately during our telephone visit.  Blood Pressure Weight BMI  BP Readings from Last 3 Encounters:  03/18/19 116/86  02/19/18 (!) 136/97  10/26/17 (!) 135/95   Wt Readings from Last 3 Encounters:  03/18/19 181 lb (82.1 kg)  05/08/18 180 lb (81.6 kg)  02/19/18 180 lb 3.2 oz (81.7 kg)   BMI Readings from Last 1 Encounters:  03/18/19 39.17 kg/m    *Unable to obtain current vital signs, weight, and BMI due to telephone visit type  Hearing/Vision  . Carlia did  seem to have difficulty with hearing/understanding during the telephone conversation . Reports that she has had a formal eye exam by an eye care professional within the past year . Reports that she has had a formal hearing evaluation within the past year *Unable to fully assess hearing and vision during telephone visit type  Cognitive Function: Unable to complete  (Normal:0-7, Significant for Dysfunction: >8)  Normal Cognitive Function Screening: No, patient has Down's Syndrome   Immunization & Health Maintenance Record Immunization History  Administered Date(s) Administered  . Influenza,inj,Quad PF,6+ Mos 11/05/2012, 10/31/2014, 10/08/2015, 11/15/2016, 10/26/2017, 11/01/2018  . Influenza-Unspecified 10/10/2013    Health Maintenance  Topic Date Due  . PAP SMEAR-Modifier  05/20/2019 (Originally 02/13/2002)  . HIV  Screening  03/17/2020 (Originally 02/14/1996)  . TETANUS/TDAP  11/06/2022  . INFLUENZA VACCINE  Completed       Assessment  This is a routine wellness examination for Lessa B Akhtar.  Health Maintenance: Due or Overdue There are no preventive care reminders to display for this patient.  Nazanin B Kubota does not  need a referral for Community Assistance: Care Management:   no Social Work:    no Prescription Assistance:  no Nutrition/Diabetes Education:  no   Plan:  Personalized Goals Goals Addressed            This Visit's Progress   . Patient Stated       04/05/2019 AWV Goal: Exercise for General Health   Patient will verbalize understanding of the benefits of increased physical activity:  Exercising regularly is important. It will improve your overall fitness, flexibility, and endurance.  Regular exercise also will improve your overall health. It can help you control your weight, reduce stress, and improve your bone density.  Over the next year, patient will increase physical activity as tolerated with a goal of at least 150 minutes of moderate physical activity per week.   You can tell that you are exercising at a moderate intensity if your heart starts beating faster and you start breathing faster but can still hold a conversation.  Moderate-intensity exercise ideas include:  Walking 1 mile (1.6 km) in about 15 minutes  Biking  Hiking  Golfing  Dancing  Water aerobics  Patient will verbalize understanding of everyday activities that increase physical activity by providing examples like the following: ? Yard work, such as: ? Pushing a Conservation officer, nature ? Raking and bagging leaves ? Washing your car ? Pushing a stroller ? Shoveling snow ? Gardening ? Washing windows or floors  Patient will be able to explain general safety guidelines for exercising:   Before you start a new exercise program, talk with your health care provider.  Do not exercise so much that you  hurt yourself, feel dizzy, or get very short of breath.  Wear comfortable clothes and wear shoes with good support.  Drink plenty of water while you exercise to prevent dehydration or heat stroke.  Work out until your breathing and your heartbeat get faster.       Personalized Health Maintenance & Screening Recommendations  Td vaccine  Lung Cancer Screening Recommended: no (Low Dose CT Chest recommended if Age 38-80 years, 30 pack-year currently smoking OR have quit w/in past 15 years) Hepatitis C Screening recommended: no HIV Screening recommended: no  Advanced Directives: Written information was not prepared per patient's request.  Referrals & Orders No orders of the defined types were placed in this encounter.   Follow-up Plan . Follow-up with Dettinger, Fransisca Kaufmann, MD as planned    I have personally reviewed and noted the following in the patient's chart:   . Medical and social history . Use of alcohol, tobacco or illicit drugs  . Current medications and supplements . Functional ability and status . Nutritional status . Physical activity . Advanced directives . List of other physicians . Hospitalizations, surgeries, and ER visits in previous 12 months . Vitals . Screenings to include cognitive, depression, and falls . Referrals and appointments  In addition, I have reviewed and discussed with Aarya B Dittmer certain preventive protocols, quality metrics, and best practice recommendations. A written personalized care plan for preventive services as well as general preventive health recommendations is available and can be mailed to the patient at her request.      Felicity Coyer, LPN   [id number]

## 2019-04-26 ENCOUNTER — Other Ambulatory Visit: Payer: Self-pay | Admitting: Internal Medicine

## 2019-04-26 DIAGNOSIS — R0989 Other specified symptoms and signs involving the circulatory and respiratory systems: Secondary | ICD-10-CM

## 2019-04-27 ENCOUNTER — Other Ambulatory Visit: Payer: Self-pay | Admitting: Family Medicine

## 2019-04-30 ENCOUNTER — Telehealth: Payer: Self-pay | Admitting: Internal Medicine

## 2019-04-30 DIAGNOSIS — R0989 Other specified symptoms and signs involving the circulatory and respiratory systems: Secondary | ICD-10-CM

## 2019-04-30 MED ORDER — COLESTIPOL HCL 1 G PO TABS: 2.0000 g | ORAL_TABLET | Freq: Every day | ORAL | 0 refills | Status: DC

## 2019-04-30 MED ORDER — OMEPRAZOLE 40 MG PO CPDR: DELAYED_RELEASE_CAPSULE | ORAL | 0 refills | Status: DC

## 2019-04-30 NOTE — Telephone Encounter (Signed)
Requesting refills on Omeprazole and Colestipol

## 2019-04-30 NOTE — Telephone Encounter (Signed)
Rx sent until 06/12/19 office visit.

## 2019-06-12 ENCOUNTER — Encounter: Payer: Self-pay | Admitting: Internal Medicine

## 2019-06-12 ENCOUNTER — Ambulatory Visit (INDEPENDENT_AMBULATORY_CARE_PROVIDER_SITE_OTHER): Payer: Medicare Other | Admitting: Internal Medicine

## 2019-06-12 VITALS — HR 80 | Ht <= 58 in | Wt 180.4 lb

## 2019-06-12 DIAGNOSIS — K219 Gastro-esophageal reflux disease without esophagitis: Secondary | ICD-10-CM | POA: Diagnosis not present

## 2019-06-12 DIAGNOSIS — Z8509 Personal history of malignant neoplasm of other digestive organs: Secondary | ICD-10-CM | POA: Diagnosis not present

## 2019-06-12 DIAGNOSIS — K529 Noninfective gastroenteritis and colitis, unspecified: Secondary | ICD-10-CM | POA: Diagnosis not present

## 2019-06-12 DIAGNOSIS — R0989 Other specified symptoms and signs involving the circulatory and respiratory systems: Secondary | ICD-10-CM | POA: Diagnosis not present

## 2019-06-12 MED ORDER — OMEPRAZOLE 40 MG PO CPDR: DELAYED_RELEASE_CAPSULE | ORAL | 2 refills | Status: DC

## 2019-06-12 MED ORDER — COLESTIPOL HCL 1 G PO TABS: 2.0000 g | ORAL_TABLET | Freq: Every day | ORAL | 2 refills | Status: DC

## 2019-06-12 NOTE — Progress Notes (Signed)
   Subjective:    Patient ID: Sara Mcgee, female    DOB: [birth year], 39 y.o.   MRN: [id number]  HPI Temperance Kundrat is a 39 year old female with Down syndrome, previously surgical resection of a proximal gastric GIST, history of gallstones and CBD stones status post ERCP and cholecystectomy, chronic loose stools and GERD who is here for follow-up. She is here today with her sister-in-law.  She reports she is doing very well. She has good control of her loose stools with colestipol 2 g daily. If she eats higher fat foods like pizza or milk products she can have looser stool. No issues with abdominal pain. Good appetite. No nausea or vomiting. Occasionally she will have "choking" with solid foods such as breads and meats. Can be more noticeable if she eats too fast or too much. The patient calls it a "frog" in her chest. She is not complaining of heartburn and continued omeprazole 40 mg daily.   Review of Systems As per HPI, otherwise negative  Current Medications, Allergies, Past Medical History, Past Surgical History, Family History and Social History were reviewed in [employer] record.     Objective:   Physical Exam Pulse 80   Ht 4\' 9"  (1.448 m)   Wt 180 lb 6.4 oz (81.8 kg)   SpO2 98%   BMI 39.04 kg/m  Gen: awake, alert, NAD HEENT: anicteric CV: RRR Pulm: CTA b/l Abd: soft, NT/ND, +BS throughout Ext: no c/c/e Neuro: nonfocal     Assessment & Plan:  39 year old female with Down syndrome, previously surgical resection of a proximal gastric GIST, history of gallstones and CBD stones status post ERCP and cholecystectomy, chronic loose stools and GERD who is here for follow-up.   1. GERD/indigestion/dysphagia --reflux seems well controlled on omeprazole 40 mg daily. Difficult for me to fully understand if she is having some dysphagia or if this occurs if she eats too quickly. We will be performing surveillance EUS this year per Dr. Ardis Hughs and I will asked that he  evaluate the esophagus by EGD at the same time to consider dilation if appropriate. --Continue omeprazole 40 mg daily --EGD with EUS  2. History of GIST tumor status post resection/postsurgical subepithelial lesion near resection margin --prior resection of gastrointestinal stromal cell tumor with surveillance EUS in April 2019. Dr. Ardis Hughs recommended surveillance at 24 months which would be now. I will send for his opinion --Consideration of repeat EUS for surveillance of previously resected GIST with persistent subepithelial lesion at resection margin, rule out recurrent stromal cell tumor; per Dr. Ardis Hughs --See #1  3. Chronic loose stools --worsening after cholecystectomy. Well controlled with colestipol continue colestipol 2 g daily  30 minutes total spent today including patient facing time, coordination of care, reviewing medical history/procedures/pertinent radiology studies, and documentation of the encounter.

## 2019-06-12 NOTE — Patient Instructions (Signed)
We have sent the following medications to your pharmacy for you to pick up at your convenience: Colestipol 2 grams every day Omeprazole 40 mg daily  You will be scheduled for endoscopic ultrasound once Dr Ardis Hughs has looked over his schedule. If you have not heard from our office within the next week, please let us know!  If you are age 38 or older, your body mass index should be between 23-30. Your Body mass index is 39.04 kg/m. If this is out of the aforementioned range listed, please consider follow up with your Primary Care Provider.  If you are age 63 or younger, your body mass index should be between 19-25. Your Body mass index is 39.04 kg/m. If this is out of the aformentioned range listed, please consider follow up with your Primary Care Provider.

## 2019-06-13 ENCOUNTER — Telehealth: Payer: Self-pay

## 2019-06-13 NOTE — Telephone Encounter (Signed)
-----   Message from [name], MD sent at 06/12/2019  3:57 PM EDT ----- I think that all sounds like a good idea.  I'll have Avarae Zwart reach out.  Thanks  Teighlor Korson, She needs my next available E[facility] at [facility] hospital for h/o GIST, dysphagia.  Thanks  ----- Message ----- From: Jerene Bears, MD Sent: 06/12/2019   3:40 PM EDT To: [name], MD  Dan, This patient is know to you and I.  Hx of GIST with persistent subepithelial lesion at resection margin EUS by you in April 2019, you recommended 2-year surveillance Could you help with repeat EUS? Would also like EGD at the same time to evaluate her GERD history and possible dysphagia Thanks Ulice Dash

## 2019-06-18 ENCOUNTER — Other Ambulatory Visit: Payer: Self-pay

## 2019-06-18 DIAGNOSIS — K219 Gastro-esophageal reflux disease without esophagitis: Secondary | ICD-10-CM

## 2019-06-18 DIAGNOSIS — K3189 Other diseases of stomach and duodenum: Secondary | ICD-10-CM

## 2019-06-18 DIAGNOSIS — Z8509 Personal history of malignant neoplasm of other digestive organs: Secondary | ICD-10-CM

## 2019-06-18 NOTE — Telephone Encounter (Signed)
EUS scheduled for 08/01/19 at 930 am [facility] with Dr Ardis Hughs. COVID test on 07/29/19 at 305  pm.  Information sent to the pt via My Chart and called. Pt caregiver understood all instructions

## 2019-06-30 ENCOUNTER — Encounter: Payer: Self-pay | Admitting: Family Medicine

## 2019-07-02 ENCOUNTER — Encounter: Payer: Self-pay | Admitting: Family

## 2019-07-02 ENCOUNTER — Ambulatory Visit (INDEPENDENT_AMBULATORY_CARE_PROVIDER_SITE_OTHER): Payer: Medicare Other | Admitting: Family

## 2019-07-02 DIAGNOSIS — B354 Tinea corporis: Secondary | ICD-10-CM | POA: Diagnosis not present

## 2019-07-02 MED ORDER — KETOCONAZOLE 2 % EX CREA: 1.0000 "application " | TOPICAL_CREAM | Freq: Every day | CUTANEOUS | 1 refills | Status: DC

## 2019-07-02 NOTE — H&P (View-Only) (Signed)
   Virtual Visit via telephone Note Due to COVID-19 pandemic this visit was conducted virtually. This visit type was conducted due to national recommendations for restrictions regarding the COVID-19 Pandemic (e.g. social distancing, sheltering in place) in an effort to limit this patient's exposure and mitigate transmission in our community. All issues noted in this document were discussed and addressed.  A physical exam was not performed with this format.  I connected with Sara Mcgee on 07/02/19 at 9:19 AM  by telephone and verified that I am speaking with the correct person using two identifiers. Sara Mcgee is currently located at home and sister in law is currently with her during visit. The provider, Evelina Dun, FNP is located in their office at time of visit.  I discussed the limitations, risks, security and privacy concerns of performing an evaluation and management service by telephone and the availability of in person appointments. I also discussed with the patient that there may be a patient responsible charge related to this service. The patient expressed understanding and agreed to proceed.   History and Present Illness:  Rash This is a new problem. The current episode started more than 1 month ago. The problem has been gradually worsening since onset. The affected locations include the chest. The rash is characterized by itchiness, redness and dryness. She was exposed to nothing. Treatments tried: anti-fungal. The treatment provided mild relief.      Review of Systems  Skin: Positive for rash.     Observations/Objective: No SOB or distress noted  Assessment and Plan: 1. Tinea corporis Keep clean and dry Avoid scratching Good hand hygiene discussed Call if symptoms worsen or do not improve  - ketoconazole (NIZORAL) 2 % cream; Apply 1 application topically daily.  Dispense: 60 g; Refill: 1    I discussed the assessment and treatment plan with the patient. The  patient was provided an opportunity to ask questions and all were answered. The patient agreed with the plan and demonstrated an understanding of the instructions.   The patient was advised to call back or seek an in-person evaluation if the symptoms worsen or if the condition fails to improve as anticipated.  The above assessment and management plan was discussed with the patient. The patient verbalized understanding of and has agreed to the management plan. Patient is aware to call the clinic if symptoms persist or worsen. Patient is aware when to return to the clinic for a follow-up visit. Patient educated on when it is appropriate to go to the emergency department.   Time call ended:  9:26 AM   I provided 7 minutes of non-face-to-face time during this encounter.    Evelina Dun, FNP

## 2019-07-02 NOTE — Progress Notes (Signed)
   Virtual Visit via telephone Note Due to COVID-19 pandemic this visit was conducted virtually. This visit type was conducted due to national recommendations for restrictions regarding the COVID-19 Pandemic (e.g. social distancing, sheltering in place) in an effort to limit this patient's exposure and mitigate transmission in our community. All issues noted in this document were discussed and addressed.  A physical exam was not performed with this format.  I connected with Sara Mcgee on 07/02/19 at 9:19 AM  by telephone and verified that I am speaking with the correct person using two identifiers. Sara Mcgee is currently located at home and sister in law is currently with her during visit. The provider, Evelina Dun, FNP is located in their office at time of visit.  I discussed the limitations, risks, security and privacy concerns of performing an evaluation and management service by telephone and the availability of in person appointments. I also discussed with the patient that there may be a patient responsible charge related to this service. The patient expressed understanding and agreed to proceed.   History and Present Illness:  Rash This is a new problem. The current episode started more than 1 month ago. The problem has been gradually worsening since onset. The affected locations include the chest. The rash is characterized by itchiness, redness and dryness. She was exposed to nothing. Treatments tried: anti-fungal. The treatment provided mild relief.      Review of Systems  Skin: Positive for rash.     Observations/Objective: No SOB or distress noted  Assessment and Plan: 1. Tinea corporis Keep clean and dry Avoid scratching Good hand hygiene discussed Call if symptoms worsen or do not improve  - ketoconazole (NIZORAL) 2 % cream; Apply 1 application topically daily.  Dispense: 60 g; Refill: 1    I discussed the assessment and treatment plan with the patient. The  patient was provided an opportunity to ask questions and all were answered. The patient agreed with the plan and demonstrated an understanding of the instructions.   The patient was advised to call back or seek an in-person evaluation if the symptoms worsen or if the condition fails to improve as anticipated.  The above assessment and management plan was discussed with the patient. The patient verbalized understanding of and has agreed to the management plan. Patient is aware to call the clinic if symptoms persist or worsen. Patient is aware when to return to the clinic for a follow-up visit. Patient educated on when it is appropriate to go to the emergency department.   Time call ended:  9:26 AM   I provided 7 minutes of non-face-to-face time during this encounter.    Evelina Dun, FNP

## 2019-07-29 ENCOUNTER — Other Ambulatory Visit (HOSPITAL_COMMUNITY): Payer: Medicare Other

## 2019-07-29 ENCOUNTER — Other Ambulatory Visit (HOSPITAL_COMMUNITY)
Admission: RE | Admit: 2019-07-29 | Discharge: 2019-07-29 | Disposition: A | Discharge status: Home / Self Care | Payer: Medicare Other | Source: Ambulatory Visit | Attending: Gastroenterology | Admitting: Gastroenterology

## 2019-07-29 DIAGNOSIS — Z20822 Contact with and (suspected) exposure to covid-19: Secondary | ICD-10-CM | POA: Insufficient documentation

## 2019-07-29 DIAGNOSIS — Z01812 Encounter for preprocedural laboratory examination: Secondary | ICD-10-CM | POA: Insufficient documentation

## 2019-07-29 LAB — SARS CORONAVIRUS 2 (TAT 6-24 HRS): SARS Coronavirus 2: NEGATIVE

## 2019-08-01 ENCOUNTER — Encounter (HOSPITAL_COMMUNITY): Payer: Self-pay | Admitting: Gastroenterology

## 2019-08-01 ENCOUNTER — Ambulatory Visit (HOSPITAL_COMMUNITY)
Admission: RE | Admit: 2019-08-01 | Discharge: 2019-08-01 | Disposition: A | Discharge status: Home / Self Care | Payer: Medicare Other | Attending: Gastroenterology | Admitting: Gastroenterology

## 2019-08-01 ENCOUNTER — Ambulatory Visit (HOSPITAL_COMMUNITY): Payer: Medicare Other | Admitting: Certified Registered Nurse Anesthetist

## 2019-08-01 ENCOUNTER — Other Ambulatory Visit: Payer: Self-pay

## 2019-08-01 ENCOUNTER — Encounter (HOSPITAL_COMMUNITY)
Admission: RE | Disposition: A | Discharge status: Home / Self Care | Payer: Self-pay | Source: Home / Self Care | Attending: Gastroenterology

## 2019-08-01 DIAGNOSIS — Z8500 Personal history of malignant neoplasm of unspecified digestive organ: Secondary | ICD-10-CM | POA: Insufficient documentation

## 2019-08-01 DIAGNOSIS — Q909 Down syndrome, unspecified: Secondary | ICD-10-CM | POA: Diagnosis not present

## 2019-08-01 DIAGNOSIS — E039 Hypothyroidism, unspecified: Secondary | ICD-10-CM | POA: Insufficient documentation

## 2019-08-01 DIAGNOSIS — E669 Obesity, unspecified: Secondary | ICD-10-CM | POA: Insufficient documentation

## 2019-08-01 DIAGNOSIS — Z6836 Body mass index (BMI) 36.0-36.9, adult: Secondary | ICD-10-CM | POA: Insufficient documentation

## 2019-08-01 DIAGNOSIS — K3189 Other diseases of stomach and duodenum: Secondary | ICD-10-CM | POA: Insufficient documentation

## 2019-08-01 DIAGNOSIS — Z8509 Personal history of malignant neoplasm of other digestive organs: Secondary | ICD-10-CM

## 2019-08-01 DIAGNOSIS — K219 Gastro-esophageal reflux disease without esophagitis: Secondary | ICD-10-CM

## 2019-08-01 HISTORY — PX: FINE NEEDLE ASPIRATION: SHX5430

## 2019-08-01 HISTORY — PX: ESOPHAGOGASTRODUODENOSCOPY (EGD) WITH PROPOFOL: SHX5813

## 2019-08-01 HISTORY — PX: EUS: SHX5427

## 2019-08-01 SURGERY — UPPER ENDOSCOPIC ULTRASOUND (EUS) RADIAL
Anesthesia: Monitor Anesthesia Care

## 2019-08-01 MED ORDER — PROPOFOL 1000 MG/100ML IV EMUL
INTRAVENOUS | Status: AC
  Filled 2019-08-01: qty 100

## 2019-08-01 MED ORDER — PROPOFOL 10 MG/ML IV BOLUS
INTRAVENOUS | Status: DC | PRN
  Administered 2019-08-01 (×2): 30 mg via INTRAVENOUS

## 2019-08-01 MED ORDER — SODIUM CHLORIDE 0.9 % IV SOLN: INTRAVENOUS | Status: DC

## 2019-08-01 MED ORDER — PHENYLEPHRINE 40 MCG/ML (10ML) SYRINGE FOR IV PUSH (FOR BLOOD PRESSURE SUPPORT)
PREFILLED_SYRINGE | INTRAVENOUS | Status: DC | PRN
  Administered 2019-08-01: 80 ug via INTRAVENOUS

## 2019-08-01 MED ORDER — ONDANSETRON HCL 4 MG/2ML IJ SOLN
INTRAMUSCULAR | Status: DC | PRN
  Administered 2019-08-01: 4 mg via INTRAVENOUS

## 2019-08-01 MED ORDER — DEXAMETHASONE SODIUM PHOSPHATE 10 MG/ML IJ SOLN
INTRAMUSCULAR | Status: DC | PRN
  Administered 2019-08-01: 10 mg via INTRAVENOUS

## 2019-08-01 MED ORDER — MIDAZOLAM HCL 2 MG/2ML IJ SOLN
INTRAMUSCULAR | Status: AC
  Filled 2019-08-01: qty 2

## 2019-08-01 MED ORDER — MIDAZOLAM HCL 2 MG/2ML IJ SOLN
INTRAMUSCULAR | Status: DC | PRN
  Administered 2019-08-01 (×2): 1 mg via INTRAVENOUS

## 2019-08-01 MED ORDER — PROPOFOL 500 MG/50ML IV EMUL
INTRAVENOUS | Status: AC
  Filled 2019-08-01: qty 50

## 2019-08-01 MED ORDER — PROPOFOL 500 MG/50ML IV EMUL
INTRAVENOUS | Status: DC | PRN
  Administered 2019-08-01: 125 ug/kg/min via INTRAVENOUS

## 2019-08-01 MED ORDER — LIDOCAINE 2% (20 MG/ML) 5 ML SYRINGE
INTRAMUSCULAR | Status: DC | PRN
  Administered 2019-08-01: 60 mg via INTRAVENOUS

## 2019-08-01 MED ORDER — LACTATED RINGERS IV SOLN
INTRAVENOUS | Status: DC
  Administered 2019-08-01: 1000 mL via INTRAVENOUS

## 2019-08-01 SURGICAL SUPPLY — 14 items

## 2019-08-01 NOTE — Transfer of Care (Signed)
Immediate Anesthesia Transfer of Care Note  Patient: Sara Mcgee  Procedure(s) Performed: UPPER ENDOSCOPIC ULTRASOUND (EUS) RADIAL (N/A ) ESOPHAGOGASTRODUODENOSCOPY (EGD) WITH PROPOFOL (N/A ) FINE NEEDLE ASPIRATION (FNA) LINEAR (N/A )  Patient Location: Endoscopy Unit  Anesthesia Type:MAC  Level of Consciousness: drowsy  Airway & Oxygen Therapy: Patient Spontanous Breathing and Patient connected to face mask  Post-op Assessment: Report given to RN and Post -op Vital signs reviewed and stable  Post vital signs: Reviewed and stable  Last Vitals:  Vitals Value Taken Time  BP 98/29 08/01/19 1018  Temp    Pulse 72 08/01/19 1020  Resp 13 08/01/19 1020  SpO2 100 % 08/01/19 1020  Vitals shown include unvalidated device data.  Last Pain:  Vitals:   08/01/19 0837  TempSrc: Oral  PainSc: 0-No pain         Complications: No complications documented.

## 2019-08-01 NOTE — Anesthesia Preprocedure Evaluation (Signed)
Anesthesia Evaluation  Patient identified by MRN, date of birth, ID band Patient awake    Reviewed: Allergy & Precautions, NPO status , Patient's Chart, lab work & pertinent test results  History of Anesthesia Complications (+) PONV  Airway Mallampati: II  TM Distance: >3 FB Neck ROM: Full    Dental no notable dental hx.    Pulmonary neg pulmonary ROS,    Pulmonary exam normal breath sounds clear to auscultation       Cardiovascular negative cardio ROS Normal cardiovascular exam Rhythm:Regular Rate:Normal     Neuro/Psych Downs syndrome negative neurological ROS  negative psych ROS   GI/Hepatic Neg liver ROS, GERD  ,  Endo/Other  Hypothyroidism obesity  Renal/GU negative Renal ROS  negative genitourinary   Musculoskeletal negative musculoskeletal ROS (+)   Abdominal   Peds negative pediatric ROS (+)  Hematology negative hematology ROS (+)   Anesthesia Other Findings   Reproductive/Obstetrics negative OB ROS                             Anesthesia Physical Anesthesia Plan  ASA: III  Anesthesia Plan: MAC   Post-op Pain Management:    Induction: Intravenous  PONV Risk Score and Plan: 3 and Propofol infusion, Dexamethasone, Ondansetron and Treatment may vary due to age or medical condition  Airway Management Planned: Simple Face Mask  Additional Equipment:   Intra-op Plan:   Post-operative Plan:   Informed Consent: I have reviewed the patients History and Physical, chart, labs and discussed the procedure including the risks, benefits and alternatives for the proposed anesthesia with the patient or authorized representative who has indicated his/her understanding and acceptance.     Dental advisory given  Plan Discussed with: CRNA and Surgeon  Anesthesia Plan Comments:         Anesthesia Quick Evaluation

## 2019-08-01 NOTE — Interval H&P Note (Signed)
History and Physical Interval Note:  08/01/2019 9:05 AM  Sara Mcgee  has presented today for surgery, with the diagnosis of h/o GIST, dysphagia- GERD.  The various methods of treatment have been discussed with the patient and family. After consideration of risks, benefits and other options for treatment, the patient has consented to  Procedure(s): UPPER ENDOSCOPIC ULTRASOUND (EUS) RADIAL (N/A) ESOPHAGOGASTRODUODENOSCOPY (EGD) WITH PROPOFOL (N/A) as a surgical intervention.  The patient's history has been reviewed, patient examined, no change in status, stable for surgery.  I have reviewed the patient's chart and labs.  Questions were answered to the patient's satisfaction.     [name]

## 2019-08-01 NOTE — Discharge Instructions (Signed)
YOU HAD AN ENDOSCOPIC PROCEDURE TODAY: Refer to the procedure report and other information in the discharge instructions given to you for any specific questions about what was found during the examination. If this information does not answer your questions, please call [place] office at [phone] to clarify.   YOU SHOULD EXPECT: Some feelings of bloating in the abdomen. Passage of more gas than usual. Walking can help get rid of the air that was put into your GI tract during the procedure and reduce the bloating. If you had a lower endoscopy (such as a colonoscopy or flexible sigmoidoscopy) you may notice spotting of blood in your stool or on the toilet paper. Some abdominal soreness may be present for a day or two, also.  DIET: Your first meal following the procedure should be a light meal and then it is ok to progress to your normal diet. A half-sandwich or bowl of soup is an example of a good first meal. Heavy or fried foods are harder to digest and may make you feel nauseous or bloated. Drink plenty of fluids but you should avoid alcoholic beverages for 24 hours. If you had a esophageal dilation, please see attached instructions for diet.    ACTIVITY: Your care partner should take you home directly after the procedure. You should plan to take it easy, moving slowly for the rest of the day. You can resume normal activity the day after the procedure however YOU SHOULD NOT DRIVE, use power tools, machinery or perform tasks that involve climbing or major physical exertion for 24 hours (because of the sedation medicines used during the test).   SYMPTOMS TO REPORT IMMEDIATELY: A gastroenterologist can be reached at any hour. Please call [phone]  for any of the following symptoms:   Following upper endoscopy (EGD, EUS, ERCP, esophageal dilation) Vomiting of blood or coffee ground material  New, significant abdominal pain  New, significant chest pain or pain under the shoulder blades  Painful or  persistently difficult swallowing  New shortness of breath  Black, tarry-looking or red, bloody stools  FOLLOW UP:  If any biopsies were taken you will be contacted by phone or by letter within the next 1-3 weeks. Call [phone]  if you have not heard about the biopsies in 3 weeks.  Please also call with any specific questions about appointments or follow up tests.  

## 2019-08-01 NOTE — Anesthesia Postprocedure Evaluation (Signed)
Anesthesia Post Note  Patient: Sara Mcgee  Procedure(s) Performed: UPPER ENDOSCOPIC ULTRASOUND (EUS) RADIAL (N/A ) ESOPHAGOGASTRODUODENOSCOPY (EGD) WITH PROPOFOL (N/A ) FINE NEEDLE ASPIRATION (FNA) LINEAR (N/A )     Patient location during evaluation: PACU Anesthesia Type: MAC Level of consciousness: awake and alert Pain management: pain level controlled Vital Signs Assessment: post-procedure vital signs reviewed and stable Respiratory status: spontaneous breathing, nonlabored ventilation, respiratory function stable and patient connected to nasal cannula oxygen Cardiovascular status: stable and blood pressure returned to baseline Postop Assessment: no apparent nausea or vomiting Anesthetic complications: no   No complications documented.  Last Vitals:  Vitals:   08/01/19 1025 08/01/19 1030  BP: (!) 94/48 (!) 113/56  Pulse: 68 68  Resp: 14 13  Temp: 36.6 C   SpO2: 100% 100%    Last Pain:  Vitals:   08/01/19 1030  TempSrc:   PainSc: Asleep                 Deron Poole S

## 2019-08-01 NOTE — Op Note (Signed)
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                            EUS 04/2016 found 3.1cm trilobed soft tissue in the                            region of anatomic distortion from the previous                            surgery, FNA negative for spindle cells. EUS 04/2017                            essentially unchanged lesion (3.0cm), not sampled                            again. Providers:                [name], MD, Cleda Daub, RN, Elspeth Cho Tech., Technician, Dellie Catholic Referring MD:             Zenovia Jarred, MD Medicines:                Monitored Anesthesia Care Complications:            No immediate complications. Estimated blood loss:                            None. Estimated Blood Loss:     Estimated blood loss: none. Procedure:                Pre-Anesthesia Assessment:                           - Prior to the procedure, a History and Physical                            was performed, and patient medications and                            allergies were reviewed. The patient's tolerance of                            previous anesthesia was  also reviewed. The risks                            and benefits of the procedure and the sedation                            options and risks were discussed with the patient.                            All questions were answered, and informed consent                            was obtained. Prior Anticoagulants: The patient has                            taken no previous  anticoagulant or antiplatelet                            agents. ASA Grade Assessment: II - A patient with                            mild systemic disease. After reviewing the risks                            and benefits, the patient was deemed in                            satisfactory condition to undergo the procedure.                           After obtaining informed consent, the endoscope was                            passed under direct vision. Throughout the                            procedure, the patient's blood pressure, pulse, and                            oxygen saturations were monitored continuously. The                            [id number] ([id number]) Olympus Radial EUS was                            introduced through the mouth, and advanced to the                            second part of duodenum. The upper EUS was                            accomplished without difficulty. The patient  tolerated the procedure well. Scope In: Scope Out: Findings:      ENDOSCOPIC FINDING: :      The examined esophagus was tortuous. The GE junction was patent however       somewhat narrowed      Proximal gastric anatomy was slightly anatomically abnormal with bulging       mucosa suggestive of submucosal lesion in the cardia, adjacent to the GE       junction. This was site of 2017 'wedge resection' of the gastric GIST.      The examined duodenum was endoscopically normal.      ENDOSONOGRAPHIC FINDING: :      1. In the very proximal stomach, adjacent to the GE junction, the       hypoechoic, heterogeneous subepithelial "lesion". This measured 3cm by       1.2cm, appeared to communcate with the muscularis propria layer of the       gastric wall and I sampled it with EUS guided FNA using a 22 gauge FNA       needle.      2. No perigastric adenopathy.      3. Gastric wall was otherwise normal by EUS.      4. Limited views of the liver, pancreas, spleen  were all normal. Impression:               - The subepithelial lesion at the site of 2017 GIST                            resection is marginally larger than 2019 (1.2cm in                            thickness now vs. 0.7cm in thickness 2019). The                            'lesion' may be contributing to her admittedly                            vague swallowing symptoms. I repeated EUS FNA                            today. Await final cytology results. Moderate Sedation:      Not Applicable - Patient had care per Anesthesia. Recommendation:           - Discharge patient to home.                           - Await cytology results. Procedure Code(s):        --- Professional ---                           [phone], Esophagogastroduodenoscopy, flexible,                            transoral; with transendoscopic ultrasound-guided                            intramural or transmural fine needle  aspiration/biopsy(s), (includes endoscopic                            ultrasound examination limited to the esophagus,                            stomach or duodenum, and adjacent structures) Diagnosis Code(s):        --- Professional ---                           K31.89, Other diseases of stomach and duodenum CPT copyright 2019 [employer] Association. All rights reserved. The codes documented in this report are preliminary and upon coder review may  be revised to meet current compliance requirements. [name], MD 08/01/2019 10:21:34 AM This report has been signed electronically. Number of Addenda: 0

## 2019-08-02 ENCOUNTER — Encounter (HOSPITAL_COMMUNITY): Payer: Self-pay | Admitting: Gastroenterology

## 2019-08-05 LAB — CYTOLOGY - NON PAP

## 2019-09-18 ENCOUNTER — Encounter: Payer: Self-pay | Admitting: Family Medicine

## 2019-09-18 ENCOUNTER — Other Ambulatory Visit: Payer: Self-pay

## 2019-09-18 ENCOUNTER — Ambulatory Visit (INDEPENDENT_AMBULATORY_CARE_PROVIDER_SITE_OTHER): Payer: Medicare Other | Admitting: Family Medicine

## 2019-09-18 VITALS — BP 127/91 | HR 80 | Temp 98.2°F | Ht 59.0 in | Wt 183.0 lb

## 2019-09-18 DIAGNOSIS — E039 Hypothyroidism, unspecified: Secondary | ICD-10-CM | POA: Diagnosis not present

## 2019-09-18 DIAGNOSIS — K219 Gastro-esophageal reflux disease without esophagitis: Secondary | ICD-10-CM | POA: Diagnosis not present

## 2019-09-18 DIAGNOSIS — E7800 Pure hypercholesterolemia, unspecified: Secondary | ICD-10-CM | POA: Diagnosis not present

## 2019-09-18 NOTE — Progress Notes (Signed)
BP (!) 127/91   Pulse 80   Temp 98.2 F (36.8 C)   Ht _0  (1.499 m)   Wt 183 lb (83 kg)   SpO2 98%   BMI 36.96 kg/m    Subjective:   Patient ID: Sara Mcgee, female    DOB: 1980-07-28, 39 y.o.   MRN: [id number]  HPI: Sara Mcgee is a 39 y.o. female presenting on 09/18/2019 for Medical Management of Chronic Issues and Hypothyroidism   HPI Hypothyroidism recheck Patient is coming in for thyroid recheck today as well. They deny any issues with hair changes or heat or cold problems or diarrhea or constipation. They deny any chest pain or palpitations. They are currently on levothyroxine 100 micrograms   Hyperlipidemia Patient is coming in for recheck of his hyperlipidemia. The patient is currently taking Colestid. They deny any issues with myalgias or history of liver damage from it. They deny any focal numbness or weakness or chest pain.   GERD Patient is currently on omeprazole.  She denies any major symptoms or abdominal pain or belching or burping. She denies any blood in her stool or lightheadedness or dizziness.   Relevant past medical, surgical, family and social history reviewed and updated as indicated. Interim medical history since our last visit reviewed. Allergies and medications reviewed and updated.  Review of Systems  Constitutional: Negative for chills and fever.  Eyes: Negative for visual disturbance.  Respiratory: Negative for chest tightness and shortness of breath.   Cardiovascular: Negative for chest pain and leg swelling.  Genitourinary: Negative for difficulty urinating and dysuria.  Musculoskeletal: Negative for back pain and gait problem.  Skin: Negative for rash.  Neurological: Negative for light-headedness and headaches.  Psychiatric/Behavioral: Negative for agitation and behavioral problems.  All other systems reviewed and are negative.   Per HPI unless specifically indicated above   Allergies as of 09/18/2019      Reactions   Adhesive  [tape] Dermatitis, Other (See Comments)   Blistering rash from drape adhesive in OR>        Medication List       Accurate as of September 18, 2019 10:41 AM. If you have any questions, ask your nurse or doctor.        STOP taking these medications   ketoconazole 2 % cream Commonly known as: NIZORAL Stopped by: Worthy Rancher, MD   loratadine 10 MG tablet Commonly known as: CLARITIN Stopped by: Fransisca Kaufmann Reggie Welge, MD     TAKE these medications   colestipol 1 g tablet Commonly known as: COLESTID Take 2 tablets (2 g total) by mouth daily. What changed: when to take this   levothyroxine 100 MCG tablet Commonly known as: SYNTHROID TAKE 1 TABLET BY MOUTH EVERY DAY What changed: when to take this   omeprazole 40 MG capsule Commonly known as: PRILOSEC TAKE 1 CAPSULE BY MOUTH EVERY DAY. What changed:   how much to take  how to take this  when to take this  additional instructions   urea 40 % Crea Commonly known as: CARMOL Apply 1 application topically daily.        Objective:   BP (!) 127/91   Pulse 80   Temp 98.2 F (36.8 C)   Ht _1  (1.499 m)   Wt 183 lb (83 kg)   SpO2 98%   BMI 36.96 kg/m   Wt Readings from Last 3 Encounters:  09/18/19 183 lb (83 kg)  08/01/19 179 lb 14.3 oz (  81.6 kg)  06/12/19 180 lb 6.4 oz (81.8 kg)    Physical Exam Vitals and nursing note reviewed.  Constitutional:      General: She is not in acute distress.    Appearance: She is well-developed. She is not diaphoretic.  Eyes:     Conjunctiva/sclera: Conjunctivae normal.  Cardiovascular:     Rate and Rhythm: Normal rate and regular rhythm.     Heart sounds: Normal heart sounds. No murmur heard.   Pulmonary:     Effort: Pulmonary effort is normal. No respiratory distress.     Breath sounds: Normal breath sounds. No wheezing.  Musculoskeletal:        General: No tenderness. Normal range of motion.  Skin:    General: Skin is warm and dry.     Findings: No rash.    Neurological:     Mental Status: She is alert and oriented to person, place, and time.     Coordination: Coordination normal.  Psychiatric:        Behavior: Behavior normal.       Assessment & Plan:   Problem List Items Addressed This Visit      Digestive   GERD (gastroesophageal reflux disease)     Endocrine   Hypothyroidism - Primary   Relevant Orders   CBC with Differential/Platelet   CMP14+EGFR   TSH     Other   HYPERCHOLESTEROLEMIA   Relevant Orders   CBC with Differential/Platelet   CMP14+EGFR   Lipid panel      Continue current medication.  No changes, will check blood work. Follow up plan: Return in about 6 months (around 03/17/2020), or if symptoms worsen or fail to improve, for Physical exam.  Counseling provided for all of the vaccine components No orders of the defined types were placed in this encounter.   Caryl Pina, MD [facility] 09/18/2019, 10:41 AM

## 2019-09-19 LAB — CMP14+EGFR
ALT: 20 IU/L (ref 0–32)
AST: 16 IU/L (ref 0–40)
Albumin/Globulin Ratio: 1.4 (ref 1.2–2.2)
Albumin: 4.1 g/dL (ref 3.8–4.8)
Alkaline Phosphatase: 90 IU/L (ref 48–121)
BUN/Creatinine Ratio: 13 (ref 9–23)
BUN: 15 mg/dL (ref 6–20)
Bilirubin Total: 0.2 mg/dL (ref 0.0–1.2)
CO2: 26 mmol/L (ref 20–29)
Calcium: 9.1 mg/dL (ref 8.7–10.2)
Chloride: 104 mmol/L (ref 96–106)
Creatinine, Ser: 1.14 mg/dL — ABNORMAL HIGH (ref 0.57–1.00)
GFR calc Af Amer: 70 mL/min/{1.73_m2} (ref >59)
GFR calc non Af Amer: 61 mL/min/{1.73_m2} (ref >59)
Globulin, Total: 3 g/dL (ref 1.5–4.5)
Glucose: 100 mg/dL — ABNORMAL HIGH (ref 65–99)
Potassium: 4.5 mmol/L (ref 3.5–5.2)
Sodium: 141 mmol/L (ref 134–144)
Total Protein: 7.1 g/dL (ref 6.0–8.5)

## 2019-09-19 LAB — CBC WITH DIFFERENTIAL/PLATELET
Basophils Absolute: 0 10*3/uL (ref 0.0–0.2)
Basos: 1 %
EOS (ABSOLUTE): 0.1 10*3/uL (ref 0.0–0.4)
Eos: 1 %
Hematocrit: 40.2 % (ref 34.0–46.6)
Hemoglobin: 13.3 g/dL (ref 11.1–15.9)
Immature Grans (Abs): 0 10*3/uL (ref 0.0–0.1)
Immature Granulocytes: 1 %
Lymphocytes Absolute: 1.5 10*3/uL (ref 0.7–3.1)
Lymphs: 27 %
MCH: 29.3 pg (ref 26.6–33.0)
MCHC: 33.1 g/dL (ref 31.5–35.7)
MCV: 89 fL (ref 79–97)
Monocytes Absolute: 0.3 10*3/uL (ref 0.1–0.9)
Monocytes: 5 %
Neutrophils Absolute: 3.8 10*3/uL (ref 1.4–7.0)
Neutrophils: 65 %
Platelets: 352 10*3/uL (ref 150–450)
RBC: 4.54 x10E6/uL (ref 3.77–5.28)
RDW: 16.3 % — ABNORMAL HIGH (ref 11.7–15.4)
WBC: 5.8 10*3/uL (ref 3.4–10.8)

## 2019-09-19 LAB — LIPID PANEL
Chol/HDL Ratio: 4.5 ratio — ABNORMAL HIGH (ref 0.0–4.4)
Cholesterol, Total: 186 mg/dL (ref 100–199)
HDL: 41 mg/dL
LDL Chol Calc (NIH): 104 mg/dL — ABNORMAL HIGH (ref 0–99)
Triglycerides: 240 mg/dL — ABNORMAL HIGH (ref 0–149)
VLDL Cholesterol Cal: 41 mg/dL — ABNORMAL HIGH (ref 5–40)

## 2019-09-19 LAB — TSH: TSH: 1.06 u[IU]/mL (ref 0.450–4.500)

## 2019-11-13 ENCOUNTER — Other Ambulatory Visit: Payer: Self-pay

## 2019-11-13 ENCOUNTER — Ambulatory Visit (INDEPENDENT_AMBULATORY_CARE_PROVIDER_SITE_OTHER): Payer: Medicare Other

## 2019-11-13 DIAGNOSIS — Z23 Encounter for immunization: Secondary | ICD-10-CM | POA: Diagnosis not present

## 2019-12-30 ENCOUNTER — Telehealth: Payer: Self-pay

## 2019-12-30 NOTE — Telephone Encounter (Signed)
  Prescription Request  12/30/2019  What is the name of the medication or equipment? Pt has been using anti fungal cream that dr Dettinger prescribed and it is not working anymore. She wants to know if something stronger can be called in  Have you contacted your pharmacy to request a refill? (if applicable) no  Which pharmacy would you like this sent to? [facility]   Patient notified that their request is being sent to the clinical staff for review and that they should receive a response within 2 business days.

## 2020-01-01 ENCOUNTER — Encounter: Payer: Self-pay | Admitting: Family Medicine

## 2020-01-01 NOTE — Telephone Encounter (Signed)
Will defer to PCP return but likely needs an OV since it is a new/ recurrent issue that hasn't been addressed in >37m.

## 2020-02-17 ENCOUNTER — Ambulatory Visit: Payer: Medicare Other | Admitting: Family Medicine

## 2020-03-18 ENCOUNTER — Encounter: Payer: Self-pay | Admitting: Family Medicine

## 2020-03-18 ENCOUNTER — Other Ambulatory Visit: Payer: Self-pay

## 2020-03-18 ENCOUNTER — Ambulatory Visit (INDEPENDENT_AMBULATORY_CARE_PROVIDER_SITE_OTHER): Payer: Medicare Other | Admitting: Family Medicine

## 2020-03-18 VITALS — BP 129/71 | HR 86 | Ht 59.0 in | Wt 180.0 lb

## 2020-03-18 DIAGNOSIS — E7800 Pure hypercholesterolemia, unspecified: Secondary | ICD-10-CM

## 2020-03-18 DIAGNOSIS — K219 Gastro-esophageal reflux disease without esophagitis: Secondary | ICD-10-CM | POA: Diagnosis not present

## 2020-03-18 DIAGNOSIS — E039 Hypothyroidism, unspecified: Secondary | ICD-10-CM

## 2020-03-18 MED ORDER — LEVOTHYROXINE SODIUM 100 MCG PO TABS: 100.0000 ug | ORAL_TABLET | Freq: Every day | ORAL | 3 refills | Status: DC

## 2020-03-18 NOTE — Progress Notes (Signed)
BP 129/71   Pulse 86   Ht 4' 11"  (1.499 m)   Wt 180 lb (81.6 kg)   SpO2 96%   BMI 36.36 kg/m    Subjective:   Patient ID: Sara Mcgee, female    DOB: Aug 26, 1980, 40 y.o.   MRN: [id number]  HPI: Sara Mcgee is a 40 y.o. female presenting on 03/18/2020 for Medical Management of Chronic Issues and Hypothyroidism   HPI Hypothyroidism recheck Patient is coming in for thyroid recheck today as well. They deny any issues with hair changes or heat or cold problems or diarrhea or constipation. They deny any chest pain or palpitations. They are currently on levothyroxine 100 micrograms   Hyperlipidemia Patient is coming in for recheck of his hyperlipidemia. The patient is currently taking colestipol. They deny any issues with myalgias or history of liver damage from it. They deny any focal numbness or weakness or chest pain.   GERD Patient is currently on omeprazole.  She denies any major symptoms or abdominal pain or belching or burping. She denies any blood in her stool or lightheadedness or dizziness.   Relevant past medical, surgical, family and social history reviewed and updated as indicated. Interim medical history since our last visit reviewed. Allergies and medications reviewed and updated.  Review of Systems  Constitutional: Negative for chills and fever.  Eyes: Negative for visual disturbance.  Respiratory: Negative for chest tightness and shortness of breath.   Cardiovascular: Negative for chest pain and leg swelling.  Musculoskeletal: Negative for back pain and gait problem.  Skin: Negative for rash.  Neurological: Negative for light-headedness and headaches.  Psychiatric/Behavioral: Negative for agitation and behavioral problems.  All other systems reviewed and are negative.   Per HPI unless specifically indicated above   Allergies as of 03/18/2020      Reactions   Adhesive [tape] Dermatitis, Other (See Comments)   Blistering rash from drape adhesive in OR>         Medication List       Accurate as of March 18, 2020  2:20 PM. If you have any questions, ask your nurse or doctor.        colestipol 1 g tablet Commonly known as: COLESTID Take 2 tablets (2 g total) by mouth daily. What changed: when to take this   levothyroxine 100 MCG tablet Commonly known as: SYNTHROID TAKE 1 TABLET BY MOUTH EVERY DAY What changed: when to take this   omeprazole 40 MG capsule Commonly known as: PRILOSEC TAKE 1 CAPSULE BY MOUTH EVERY DAY. What changed:   how much to take  how to take this  when to take this  additional instructions   urea 40 % Crea Commonly known as: CARMOL Apply 1 application topically daily.        Objective:   Ht 4' 11"  (1.499 m)   Wt 180 lb (81.6 kg)   BMI 36.36 kg/m   Wt Readings from Last 3 Encounters:  03/18/20 180 lb (81.6 kg)  09/18/19 183 lb (83 kg)  08/01/19 179 lb 14.3 oz (81.6 kg)    Physical Exam Vitals and nursing note reviewed.  Constitutional:      General: She is not in acute distress.    Appearance: She is well-developed and well-nourished. She is not diaphoretic.  Eyes:     Extraocular Movements: EOM normal.     Conjunctiva/sclera: Conjunctivae normal.  Cardiovascular:     Rate and Rhythm: Normal rate and regular rhythm.  Pulses: Intact distal pulses.     Heart sounds: Normal heart sounds. No murmur heard.   Pulmonary:     Effort: Pulmonary effort is normal. No respiratory distress.     Breath sounds: Normal breath sounds. No wheezing.  Musculoskeletal:        General: No tenderness or edema. Normal range of motion.  Skin:    General: Skin is warm and dry.     Findings: No rash.  Neurological:     Mental Status: She is alert and oriented to person, place, and time.     Coordination: Coordination normal.  Psychiatric:        Mood and Affect: Mood and affect normal.        Behavior: Behavior normal.       Assessment & Plan:   Problem List Items Addressed This Visit       Digestive   GERD (gastroesophageal reflux disease)   Relevant Orders   CBC with Differential/Platelet   CMP14+EGFR     Endocrine   Hypothyroidism - Primary   Relevant Medications   levothyroxine (SYNTHROID) 100 MCG tablet   Other Relevant Orders   TSH     Other   HYPERCHOLESTEROLEMIA   Relevant Orders   CMP14+EGFR   Lipid panel     Continue current medication, will check blood work, follow-up in a year.  Follow up plan: Return in about 1 year (around 03/18/2021), or if symptoms worsen or fail to improve, for Thyroid and physical.  Counseling provided for all of the vaccine components No orders of the defined types were placed in this encounter.   Caryl Pina, MD [facility] 03/18/2020, 2:20 PM

## 2020-03-19 LAB — CBC WITH DIFFERENTIAL/PLATELET
Basophils Absolute: 0.1 10*3/uL (ref 0.0–0.2)
Basos: 1 %
EOS (ABSOLUTE): 0.1 10*3/uL (ref 0.0–0.4)
Eos: 1 %
Hematocrit: 38 % (ref 34.0–46.6)
Hemoglobin: 12.2 g/dL (ref 11.1–15.9)
Immature Grans (Abs): 0.1 10*3/uL (ref 0.0–0.1)
Immature Granulocytes: 1 %
Lymphocytes Absolute: 1.8 10*3/uL (ref 0.7–3.1)
Lymphs: 28 %
MCH: 28.4 pg (ref 26.6–33.0)
MCHC: 32.1 g/dL (ref 31.5–35.7)
MCV: 88 fL (ref 79–97)
Monocytes Absolute: 0.4 10*3/uL (ref 0.1–0.9)
Monocytes: 6 %
Neutrophils Absolute: 4.2 10*3/uL (ref 1.4–7.0)
Neutrophils: 63 %
Platelets: 353 10*3/uL (ref 150–450)
RBC: 4.3 x10E6/uL (ref 3.77–5.28)
RDW: 16.9 % — ABNORMAL HIGH (ref 11.7–15.4)
WBC: 6.6 10*3/uL (ref 3.4–10.8)

## 2020-03-19 LAB — CMP14+EGFR
ALT: 14 IU/L (ref 0–32)
AST: 15 IU/L (ref 0–40)
Albumin/Globulin Ratio: 1.4 (ref 1.2–2.2)
Albumin: 4.1 g/dL (ref 3.8–4.8)
Alkaline Phosphatase: 82 IU/L (ref 44–121)
BUN/Creatinine Ratio: 11 (ref 9–23)
BUN: 13 mg/dL (ref 6–20)
Bilirubin Total: 0.4 mg/dL (ref 0.0–1.2)
CO2: 23 mmol/L (ref 20–29)
Calcium: 8.9 mg/dL (ref 8.7–10.2)
Chloride: 103 mmol/L (ref 96–106)
Creatinine, Ser: 1.16 mg/dL — ABNORMAL HIGH (ref 0.57–1.00)
Globulin, Total: 3 g/dL (ref 1.5–4.5)
Glucose: 86 mg/dL (ref 65–99)
Potassium: 4.5 mmol/L (ref 3.5–5.2)
Sodium: 140 mmol/L (ref 134–144)
Total Protein: 7.1 g/dL (ref 6.0–8.5)
eGFR: 62 mL/min/{1.73_m2} (ref >59)

## 2020-03-19 LAB — LIPID PANEL
Chol/HDL Ratio: 3.8 ratio (ref 0.0–4.4)
Cholesterol, Total: 162 mg/dL (ref 100–199)
HDL: 43 mg/dL (ref >39)
LDL Chol Calc (NIH): 95 mg/dL (ref 0–99)
Triglycerides: 132 mg/dL (ref 0–149)
VLDL Cholesterol Cal: 24 mg/dL (ref 5–40)

## 2020-03-19 LAB — TSH: TSH: 2.32 u[IU]/mL (ref 0.450–4.500)

## 2020-04-06 ENCOUNTER — Other Ambulatory Visit: Payer: Self-pay | Admitting: Internal Medicine

## 2020-04-06 DIAGNOSIS — R0989 Other specified symptoms and signs involving the circulatory and respiratory systems: Secondary | ICD-10-CM

## 2020-04-06 DIAGNOSIS — R198 Other specified symptoms and signs involving the digestive system and abdomen: Secondary | ICD-10-CM

## 2020-07-04 ENCOUNTER — Other Ambulatory Visit: Payer: Self-pay | Admitting: Internal Medicine

## 2020-07-04 DIAGNOSIS — R0989 Other specified symptoms and signs involving the circulatory and respiratory systems: Secondary | ICD-10-CM

## 2020-07-05 ENCOUNTER — Other Ambulatory Visit: Payer: Self-pay | Admitting: Internal Medicine

## 2020-07-17 ENCOUNTER — Encounter: Payer: Self-pay | Admitting: Internal Medicine

## 2020-07-17 ENCOUNTER — Ambulatory Visit (INDEPENDENT_AMBULATORY_CARE_PROVIDER_SITE_OTHER): Payer: Medicare Other | Admitting: Internal Medicine

## 2020-07-17 VITALS — BP 102/70 | HR 88 | Ht 59.0 in | Wt 177.0 lb

## 2020-07-17 DIAGNOSIS — B369 Superficial mycosis, unspecified: Secondary | ICD-10-CM | POA: Diagnosis not present

## 2020-07-17 DIAGNOSIS — K529 Noninfective gastroenteritis and colitis, unspecified: Secondary | ICD-10-CM

## 2020-07-17 DIAGNOSIS — K219 Gastro-esophageal reflux disease without esophagitis: Secondary | ICD-10-CM

## 2020-07-17 DIAGNOSIS — Z8509 Personal history of malignant neoplasm of other digestive organs: Secondary | ICD-10-CM | POA: Diagnosis not present

## 2020-07-17 MED ORDER — COLESTIPOL HCL 1 G PO TABS: 2.0000 g | ORAL_TABLET | Freq: Every day | ORAL | 1 refills | Status: DC

## 2020-07-17 MED ORDER — OMEPRAZOLE 40 MG PO CPDR: 40.0000 mg | DELAYED_RELEASE_CAPSULE | Freq: Every day | ORAL | 1 refills | Status: DC

## 2020-07-17 NOTE — Progress Notes (Signed)
   Subjective:    Patient ID: Sara Mcgee, female    DOB: December 30, 1980, 40 y.o.   MRN: [id number]  HPI Sara Mcgee is a 40 year old female with history of Down syndrome, proximal gastric GIST status postresection, history of gallstones and CBD stones with prior ERCP and cholecystectomy, GERD and chronic loose stools who is here for follow-up.  She is here today with her sister-in-law.  Both the patient and her sister-in-law reports she is doing very well.  She is maintained on omeprazole 40 mg daily and colestipol 2 g daily.  With this her heartburn and GERD symptoms are well controlled.  Her loose stools are also well controlled and she is not having diarrhea.  She denies abdominal pain.  Occasionally she will have issues swallowing the colestipol tablet because of the size but on most days this is not a problem.  She is not complaining of solid food dysphagia.  No report of blood in stool or melena.  She denies belly pain.  She has been dealing with a rash on her upper chest which has been treated with ketoconazole.  The rash resolved with therapy but then comes back with a discontinued.  She is also had a rash on her feet.  The rash is pruritic in nature but not painful.  She will occasionally on the weekends go and spend 1 or 2 nights with her mother and father.  Her mother is 69 now with significant dementia and her father is 38 and dealing with recurrence of non-Hodgkin's lymphoma.  Occasionally when she returns on Monday she will have looser stools than normal and this is attributed to higher fat diet when she is staying with her parents.   Review of Systems As per HPI, otherwise negative  Current Medications, Allergies, Past Medical History, Past Surgical History, Family History and Social History were reviewed in [employer] record.    Objective:   Physical Exam BP 102/70   Pulse 88   Ht 4\' 11"  (1.499 m)   Wt 177 lb (80.3 kg)   BMI 35.75 kg/m  Gen: awake,  alert, NAD HEENT: anicteric CV: RRR, 2/6 sem Pulm: CTA b/l Abd: soft, obese, NT/ND, +BS throughout Ext: no c/c/e Skin: Left upper chest wall erythematous slightly raised and circular rash most consistent with tenia Neuro: nonfocal      Assessment & Plan:  40 year old female with history of Down syndrome, proximal gastric GIST status postresection, history of gallstones and CBD stones with prior ERCP and cholecystectomy, GERD and chronic loose stools who is here for follow-up.    GERD/indigestion/dyspepsia --well-controlled with omeprazole, continue omeprazole 40 mg daily  2.  Gastric GIST with prior resection/subepithelial lesion the resection margin --receiving surveillance EUS with Dr. Ardis Hughs on an every 2-year interval for now.  This was last done 1 year ago.  FNA did not show recurrent stromal cell tumor at that time though the subepithelial lesion at the resection margin had slightly increased in size --EUS summer 2023 with Dr. Ardis Hughs  3.  Chronic loose stools/prior cholecystectomy --well controlled with colestipol continue 2 g daily  4.  Fungal rash --treated with ketoconazole but recurring.  I recommended she see dermatology for further therapeutic recommendations.  Also wise to have a routine skin check.

## 2020-07-17 NOTE — Patient Instructions (Signed)
Continue colestipol.  Continue omeprazole.   Please follow up with Dr Hilarie Fredrickson in 1 year.  See dermatology about your fungal rash. You may see if [facility] takes your insurance.  If you are age 39 or younger, your body mass index should be between 19-25. Your Body mass index is 35.75 kg/m. If this is out of the aformentioned range listed, please consider follow up with your Primary Care Provider.   __________________________________________________________  The [facility] providers would like to encourage you to use [facility] to communicate with providers for non-urgent requests or questions.  Due to long hold times on the telephone, sending your provider a message by [facility] may be a faster and more efficient way to get a response.  Please allow 48 business hours for a response.  Please remember that this is for non-urgent requests.   Due to recent changes in healthcare laws, you may see the results of your imaging and laboratory studies on MyChart before your provider has had a chance to review them.  We understand that in some cases there may be results that are confusing or concerning to you. Not all laboratory results come back in the same time frame and the provider may be waiting for multiple results in order to interpret others.  Please give [place] 48 hours in order for your provider to thoroughly review all the results before contacting the office for clarification of your results.

## 2020-09-21 ENCOUNTER — Ambulatory Visit (INDEPENDENT_AMBULATORY_CARE_PROVIDER_SITE_OTHER): Payer: Medicare Other

## 2020-09-21 VITALS — Ht 59.0 in | Wt 179.0 lb

## 2020-09-21 DIAGNOSIS — Z0000 Encounter for general adult medical examination without abnormal findings: Secondary | ICD-10-CM

## 2020-09-21 DIAGNOSIS — H90A31 Mixed conductive and sensorineural hearing loss, unilateral, right ear with restricted hearing on the contralateral side: Secondary | ICD-10-CM | POA: Insufficient documentation

## 2020-09-21 NOTE — Progress Notes (Signed)
Subjective:   Sara Mcgee is a 40 y.o. female who presents for Medicare Annual (Subsequent) preventive examination.  Virtual Visit via Telephone Note  I connected with  Sara Mcgee on 09/21/20 at  2:00 PM EDT by telephone and verified that I am speaking with the correct person using two identifiers.  Location: Patient: Home Provider: [facility] Persons participating in the virtual visit: patient/sister, Janine/Nurse Health Advisor   I discussed the limitations, risks, security and privacy concerns of performing an evaluation and management service by telephone and the availability of in person appointments. The patient expressed understanding and agreed to proceed.  Interactive audio and video telecommunications were attempted between this nurse and patient, however failed, due to patient having technical difficulties OR patient did not have access to video capability.  We continued and completed visit with audio only.  Some vital signs may be absent or patient reported.   Sara Mcgee E Jahvier Aldea, LPN   Review of Systems     Cardiac Risk Factors include: obesity (BMI >30kg/m2);sedentary lifestyle;dyslipidemia;Other (see comment), Risk factor comments: Down Syndrome     Objective:    Today's Vitals   09/21/20 1459  Weight: 179 lb (81.2 kg)  Height: '4\' 11"'$  (1.499 m)   Body mass index is 36.15 kg/m.  Advanced Directives 08/01/2019 04/05/2019 04/27/2017 04/21/2017 04/21/2017 02/15/2017 05/05/2016  Does Patient Have a Medical Advance Directive? Yes Yes Yes Yes Yes Yes Yes  Type of Advance Directive Healthcare Power of [place] of [place]  Does patient want to make changes to medical advance directive? - No - Patient declined - - No - Patient declined No - Patient declined -  Copy of [place] in Chart? No - copy requested No - copy requested No -  copy requested - No - copy requested No - copy requested No - copy requested  Would patient like information on creating a medical advance directive? - - - - No - Patient declined - -    Current Medications (verified) Outpatient Encounter Medications as of 09/21/2020  Medication Sig   colestipol (COLESTID) 1 g tablet Take 2 tablets (2 g total) by mouth daily.   levothyroxine (SYNTHROID) 100 MCG tablet Take 1 tablet (100 mcg total) by mouth daily.   omeprazole (PRILOSEC) 40 MG capsule Take 1 capsule (40 mg total) by mouth daily.   sodium fluoride (FLUORISHIELD) 1.1 % GEL dental gel PLEASE SEE ATTACHED FOR DETAILED DIRECTIONS   urea (CARMOL) 40 % CREA Apply 1 application topically daily.   No facility-administered encounter medications on file as of 09/21/2020.    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)    GERD (gastroesophageal reflux disease)    Hard of hearing    Hepatic steatosis    Hypothyroidism    PONV (postoperative nausea and vomiting)    after last endo at [facility]   Past Surgical History:  Procedure Laterality Date   CHOLECYSTECTOMY N/A 04/23/2015   Procedure: LAPAROSCOPIC CHOLECYSTECTOMY WITH INTRAOPERATIVE  CHOLANGIOGRAM;  Surgeon: Stark Klein, MD;  Location: [place];  Service: General;  Laterality: N/A;   ENDOSCOPIC RETROGRADE CHOLANGIOPANCREATOGRAPHY (ERCP) WITH PROPOFOL N/A 02/26/2015   Procedure: ENDOSCOPIC RETROGRADE CHOLANGIOPANCREATOGRAPHY (ERCP) WITH PROPOFOL;  Surgeon: [name], MD;  Location: [facility];  Service: Endoscopy;  Laterality: N/A;   ESOPHAGOGASTRODUODENOSCOPY N/A 04/23/2015   Procedure: ESOPHAGOGASTRODUODENOSCOPY (EGD);   Surgeon: Stark Klein, MD;  Location: [place];  Service: General;  Laterality: N/A;   ESOPHAGOGASTRODUODENOSCOPY (EGD) WITH PROPOFOL N/A 08/01/2019   Procedure: ESOPHAGOGASTRODUODENOSCOPY (EGD) WITH PROPOFOL;  Surgeon: [name], MD;  Location: [facility];  Service: Endoscopy;  Laterality: N/A;   EUS N/A 02/26/2015   Procedure: UPPER ENDOSCOPIC ULTRASOUND (EUS) LINEAR;  Surgeon: [name], MD;  Location: [facility];  Service: Endoscopy;  Laterality: N/A;   EUS N/A 05/05/2016   Procedure: UPPER ENDOSCOPIC ULTRASOUND (EUS) LINEAR;  Surgeon: [name], MD;  Location: [facility];  Service: Endoscopy;  Laterality: N/A;  General?   EUS N/A 04/27/2017   Procedure: UPPER ENDOSCOPIC ULTRASOUND (EUS) RADIAL;  Surgeon: [name], MD;  Location: [facility];  Service: Endoscopy;  Laterality: N/A;   EUS N/A 08/01/2019   Procedure: UPPER ENDOSCOPIC ULTRASOUND (EUS) RADIAL;  Surgeon: [name], MD;  Location: [facility];  Service: Endoscopy;  Laterality: N/A;   FINE NEEDLE ASPIRATION N/A 08/01/2019   Procedure: FINE NEEDLE ASPIRATION (FNA) LINEAR;  Surgeon: [name], MD;  Location: [facility];  Service: Endoscopy;  Laterality: N/A;   LAPAROSCOPIC GASTRIC RESECTION N/A 04/23/2015   Procedure: LAPAROSCOPIC PARTIAL GASTRECTOMY;  Surgeon: Stark Klein, MD;  Location: [facility];  Service: General;  Laterality: N/A;   NASAL ENDOSCOPY     TONSILLECTOMY     WISDOM TOOTH EXTRACTION     Family History  Problem Relation Age of Onset   Hypertension Mother    Diabetes Mother    Kidney disease Mother        Stage 2   Dementia Mother    Hypertension Father    Heart disease Father    Cancer Father        lymphoma   Diabetes Maternal Grandmother    Colon cancer Neg Hx    Colon polyps Neg Hx    Esophageal cancer Neg Hx    Social History   Socioeconomic History   Marital status: Single    Spouse name: Not on file   Number of children: 0   Years of education: 12    Highest education level: 12th grade  Occupational History   Occupation: disability  Tobacco Use   Smoking status: Never   Smokeless tobacco: Never  Vaping Use   Vaping Use: Never used  Substance and Sexual Activity   Alcohol use: No    Alcohol/week: 0.0 standard drinks   Drug use: No   Sexual activity: Never  Other Topics Concern   Not on file  Social History Narrative   Down Syndrome   She lives with her sister, Sherlyn Hay and brother in law   Her parents live 2 houses down   Social Determinants of Health   Financial Resource Strain: Low Risk    Difficulty of Paying Living Expenses: Not hard at all  Food Insecurity: No Food Insecurity   Worried About Charity fundraiser in the Last Year: Never true   Arboriculturist in the Last Year: Never true  Transportation Needs: No Transportation Needs   Lack of Transportation (Medical): No   Lack of  Transportation (Non-Medical): No  Physical Activity: Insufficiently Active   Days of Exercise per Week: 4 days   Minutes of Exercise per Session: 20 min  Stress: No Stress Concern Present   Feeling of Stress : Not at all  Social Connections: Moderately Integrated   Frequency of Communication with Friends and Family: Twice a week   Frequency of Social Gatherings with Friends and Family: More than three times a week   Attends Religious Services: More than 4 times per year   Active Member of [employer] or Organizations: Yes   Attends Music therapist: More than 4 times per year   Marital Status: Never married    Tobacco Counseling Counseling given: Not Answered   Clinical Intake:  Pre-visit preparation completed: Yes  Pain : No/denies pain     BMI - recorded: 36.15 Nutritional Status: BMI > 30  Obese Nutritional Risks: None Diabetes: No  How often do you need to have someone help you when you read instructions, pamphlets, or other written materials from your doctor or pharmacy?: 5 - Always What is the last grade level  you completed in school?: 12  Diabetic? No  Interpreter Needed?: No  Comments: sister, Sherlyn Hay is her caretaker Information entered by :: Mickala Laton, LPN   Activities of Daily Living In your present state of health, do you have any difficulty performing the following activities: 09/21/2020  Hearing? Y  Vision? N  Difficulty concentrating or making decisions? Y  Walking or climbing stairs? Y  Dressing or bathing? N  Doing errands, shopping? Y  Preparing Food and eating ? Y  Using the Toilet? N  In the past six months, have you accidently leaked urine? N  Do you have problems with loss of bowel control? N  Managing your Medications? Y  Managing your Finances? Y  Housekeeping or managing your Housekeeping? Y  Some recent data might be hidden    Patient Care Team: Dettinger, Fransisca Kaufmann, MD as PCP - General (Family Medicine) Pyrtle, Lajuan Lines, MD as Consulting Physician (Gastroenterology)  Indicate any recent Medical Services you may have received from other than [facility] providers in the past year (date may be approximate).     Assessment:   This is a routine wellness examination for Sara Mcgee.  Hearing/Vision screen Hearing Screening - Comments:: Wears hearing aids - sees Dr Ronette Deter at [facility] annually Vision Screening - Comments:: Wears eyeglasses - up to date with annual eye exams with Dr Marin Comment in [place]  Dietary issues and exercise activities discussed: Current Exercise Habits: Home exercise routine, Type of exercise: walking, Time (Minutes): 20, Frequency (Times/Week): 4, Weekly Exercise (Minutes/Week): 80, Intensity: Mild, Exercise limited by: None identified   Goals Addressed             This Visit's Progress    Exercise 2-3x per week (30 min per time)   Not on track    Increase walking to 30 minutes 2-3 times per week.     Patient Stated   Not on track    04/05/2019 AWV Goal: Exercise for General Health  Patient will verbalize understanding of the benefits of increased  physical activity: Exercising regularly is important. It will improve your overall fitness, flexibility, and endurance. Regular exercise also will improve your overall health. It can help you control your weight, reduce stress, and improve your bone density. Over the next year, patient will increase physical activity as tolerated with a goal of at least 150 minutes of moderate physical activity per week.  You can tell that you are exercising at a moderate intensity if your heart starts beating faster and you start breathing faster but can still hold a conversation. Moderate-intensity exercise ideas include: Walking 1 mile (1.6 km) in about 15 minutes Biking Hiking Golfing Dancing Water aerobics Patient will verbalize understanding of everyday activities that increase physical activity by providing examples like the following: Yard work, such as: Sales promotion account executive Gardening Washing windows or floors Patient will be able to explain general safety guidelines for exercising:  Before you start a new exercise program, talk with your health care provider. Do not exercise so much that you hurt yourself, feel dizzy, or get very short of breath. Wear comfortable clothes and wear shoes with good support. Drink plenty of water while you exercise to prevent dehydration or heat stroke. Work out until your breathing and your heartbeat get faster.        Depression Screen PHQ 2/9 Scores 09/21/2020 03/18/2020 09/18/2019 03/18/2019 02/19/2018 10/26/2017 05/01/2017  PHQ - 2 Score 0 0 0 0 0 0 0    Fall Risk Fall Risk  09/21/2020 03/18/2020 09/18/2019 03/18/2019 02/15/2017  Falls in the past year? 0 0 0 0 No  Number falls in past yr: 0 - - - -  Injury with Fall? 0 - - - -  Risk for fall due to : Impaired vision - - - -  Follow up Falls prevention discussed - - - -    FALL RISK PREVENTION PERTAINING TO THE HOME:  Any stairs in or  around the home? No  If so, are there any without handrails? No  Home free of loose throw rugs in walkways, pet beds, electrical cords, etc? Yes  Adequate lighting in your home to reduce risk of falls? Yes   ASSISTIVE DEVICES UTILIZED TO PREVENT FALLS:  Life alert? No  Use of a cane, walker or w/c? No  Grab bars in the bathroom? Yes  Shower chair or bench in shower? Yes  Elevated toilet seat or a handicapped toilet? Yes   TIMED UP AND GO:  Was the test performed? No . Telephonic visit.  Cognitive Function: Cognitive status assessed by direct observation. Patient has current diagnosis of Down Syndrome. Patient is unable to complete screening 6CIT or MMSE.   MMSE - Mini Mental State Exam 09/21/2020 04/05/2019 02/15/2017  Not completed: Unable to complete Unable to complete Unable to complete        Immunizations Immunization History  Administered Date(s) Administered   Influenza,inj,Quad PF,6+ Mos 11/05/2012, 10/31/2014, 10/08/2015, 11/15/2016, 10/26/2017, 11/01/2018, 11/13/2019   Influenza-Unspecified 10/10/2013   PFIZER(Purple Top)SARS-COV-2 Vaccination 04/03/2019, 04/24/2019, 11/25/2019    TDAP status: Due, Education has been provided regarding the importance of this vaccine. Advised may receive this vaccine at local pharmacy or Health Dept. Aware to provide a copy of the vaccination record if obtained from local pharmacy or Health Dept. Verbalized acceptance and understanding.  Flu Vaccine status: Up to date  Covid-19 vaccine status: Completed vaccines  Qualifies for Shingles Vaccine? No   Zostavax completed No    Screening Tests Health Maintenance  Topic Date Due   INFLUENZA VACCINE  08/10/2020   Hepatitis C Screening  03/18/2021 (Originally 02/14/1999)   HIV Screening  03/18/2021 (Originally 02/14/1996)   PAP SMEAR-Modifier  03/26/2021 (Originally 02/13/2002)   TETANUS/TDAP  11/06/2022   COVID-19 Vaccine  Completed   Pneumococcal Vaccine 53-65 Years old  Aged Out  HPV  VACCINES  Aged Out    Health Maintenance  Health Maintenance Due  Topic Date Due   INFLUENZA VACCINE  08/10/2020    Mammogram, Bone Density and Colonoscopy not yet due  Lung Cancer Screening: (Low Dose CT Chest recommended if Age 73-80 years, 30 pack-year currently smoking OR have quit w/in 15years.) does not qualify.   Additional Screening:  Hepatitis C Screening: does not qualify  Vision Screening: Recommended annual ophthalmology exams for early detection of glaucoma and other disorders of the eye. Is the patient up to date with their annual eye exam?  Yes  Who is the provider or what is the name of the office in which the patient attends annual eye exams? Dr Anthony Sar If pt is not established with a provider, would they like to be referred to a provider to establish care? No .   Dental Screening: Recommended annual dental exams for proper oral hygiene  Community Resource Referral / Chronic Care Management: CRR required this visit?  No   CCM required this visit?  No      Plan:     I have personally reviewed and noted the following in the patient's chart:   Medical and social history Use of alcohol, tobacco or illicit drugs  Current medications and supplements including opioid prescriptions.  Functional ability and status Nutritional status Physical activity Advanced directives List of other physicians Hospitalizations, surgeries, and ER visits in previous 12 months Vitals Screenings to include cognitive, depression, and falls Referrals and appointments  In addition, I have reviewed and discussed with patient certain preventive protocols, quality metrics, and best practice recommendations. A written personalized care plan for preventive services as well as general preventive health recommendations were provided to patient.     Sandrea Hammond, LPN   [id number]   Nurse Notes: None

## 2020-09-21 NOTE — Patient Instructions (Signed)
Sara Mcgee , Thank you for taking time to come for your Medicare Wellness Visit. I appreciate your ongoing commitment to your health goals. Please review the following plan we discussed and let me know if I can assist you in the future.   Screening recommendations/referrals: Colonoscopy: Due at age 39 Mammogram: Due at age 64 Bone Density: Due at age 83 Recommended yearly ophthalmology/optometry visit for glaucoma screening and checkup Recommended yearly dental visit for hygiene and checkup  Vaccinations: Influenza vaccine: done 11/13/2019 - Repeat annually Pneumococcal vaccine: Due after age 26 Tdap vaccine: Due. Recommend every 10 years Shingles vaccine: Due after age 85  Covid-19: Done 04/03/19, 04/24/19, & 11/25/19  Conditions/risks identified: Aim for 30 minutes of exercise or brisk walking each day, drink 6-8 glasses of water and eat lots of fruits and vegetables.   Next appointment: Follow up in one year for your annual wellness visit.   Preventive Care 35-64 Years, Female Preventive care refers to lifestyle choices and visits with your health care provider that can promote health and wellness. What does preventive care include? A yearly physical exam. This is also called an annual well check. Dental exams once or twice a year. Routine eye exams. Ask your health care provider how often you should have your eyes checked. Personal lifestyle choices, including: Daily care of your teeth and gums. Regular physical activity. Eating a healthy diet. Avoiding tobacco and drug use. Limiting alcohol use. Practicing safe sex. Taking low-dose aspirin daily starting at age 96. Taking vitamin and mineral supplements as recommended by your health care provider. What happens during an annual well check? The services and screenings done by your health care provider during your annual well check will depend on your age, overall health, lifestyle risk factors, and family history of  disease. Counseling  Your health care provider may ask you questions about your: Alcohol use. Tobacco use. Drug use. Emotional well-being. Home and relationship well-being. Sexual activity. Eating habits. Work and work Statistician. Method of birth control. Menstrual cycle. Pregnancy history. Screening  You may have the following tests or measurements: Height, weight, and BMI. Blood pressure. Lipid and cholesterol levels. These may be checked every 5 years, or more frequently if you are over 68 years old. Skin check. Lung cancer screening. You may have this screening every year starting at age 29 if you have a 30-pack-year history of smoking and currently smoke or have quit within the past 15 years. Fecal occult blood test (FOBT) of the stool. You may have this test every year starting at age 35. Flexible sigmoidoscopy or colonoscopy. You may have a sigmoidoscopy every 5 years or a colonoscopy every 10 years starting at age 26. Hepatitis C blood test. Hepatitis B blood test. Sexually transmitted disease (STD) testing. Diabetes screening. This is done by checking your blood sugar (glucose) after you have not eaten for a while (fasting). You may have this done every 1-3 years. Mammogram. This may be done every 1-2 years. Talk to your health care provider about when you should start having regular mammograms. This may depend on whether you have a family history of breast cancer. BRCA-related cancer screening. This may be done if you have a family history of breast, ovarian, tubal, or peritoneal cancers. Pelvic exam and Pap test. This may be done every 3 years starting at age 59. Starting at age 80, this may be done every 5 years if you have a Pap test in combination with an HPV test. Bone density scan. This  is done to screen for osteoporosis. You may have this scan if you are at high risk for osteoporosis. Discuss your test results, treatment options, and if necessary, the need for more  tests with your health care provider. Vaccines  Your health care provider may recommend certain vaccines, such as: Influenza vaccine. This is recommended every year. Tetanus, diphtheria, and acellular pertussis (Tdap, Td) vaccine. You may need a Td booster every 10 years. Zoster vaccine. You may need this after age 1. Pneumococcal 13-valent conjugate (PCV13) vaccine. You may need this if you have certain conditions and were not previously vaccinated. Pneumococcal polysaccharide (PPSV23) vaccine. You may need one or two doses if you smoke cigarettes or if you have certain conditions. Talk to your health care provider about which screenings and vaccines you need and how often you need them. This information is not intended to replace advice given to you by your health care provider. Make sure you discuss any questions you have with your health care provider. Document Released: 01/23/2015 Document Revised: 09/16/2015 Document Reviewed: 10/28/2014 Elsevier Interactive Patient Education  2017 [place] Prevention in the Home Falls can cause injuries. They can happen to people of all ages. There are many things you can do to make your home safe and to help prevent falls. What can I do on the outside of my home? Regularly fix the edges of walkways and driveways and fix any cracks. Remove anything that might make you trip as you walk through a door, such as a raised step or threshold. Trim any bushes or trees on the path to your home. Use bright outdoor lighting. Clear any walking paths of anything that might make someone trip, such as rocks or tools. Regularly check to see if handrails are loose or broken. Make sure that both sides of any steps have handrails. Any raised decks and porches should have guardrails on the edges. Have any leaves, snow, or ice cleared regularly. Use sand or salt on walking paths during winter. Clean up any spills in your garage right away. This includes  oil or grease spills. What can I do in the bathroom? Use night lights. Install grab bars by the toilet and in the tub and shower. Do not use towel bars as grab bars. Use non-skid mats or decals in the tub or shower. If you need to sit down in the shower, use a plastic, non-slip stool. Keep the floor dry. Clean up any water that spills on the floor as soon as it happens. Remove soap buildup in the tub or shower regularly. Attach bath mats securely with double-sided non-slip rug tape. Do not have throw rugs and other things on the floor that can make you trip. What can I do in the bedroom? Use night lights. Make sure that you have a light by your bed that is easy to reach. Do not use any sheets or blankets that are too big for your bed. They should not hang down onto the floor. Have a firm chair that has side arms. You can use this for support while you get dressed. Do not have throw rugs and other things on the floor that can make you trip. What can I do in the kitchen? Clean up any spills right away. Avoid walking on wet floors. Keep items that you use a lot in easy-to-reach places. If you need to reach something above you, use a strong step stool that has a grab bar. Keep electrical cords out  of the way. Do not use floor polish or wax that makes floors slippery. If you must use wax, use non-skid floor wax. Do not have throw rugs and other things on the floor that can make you trip. What can I do with my stairs? Do not leave any items on the stairs. Make sure that there are handrails on both sides of the stairs and use them. Fix handrails that are broken or loose. Make sure that handrails are as long as the stairways. Check any carpeting to make sure that it is firmly attached to the stairs. Fix any carpet that is loose or worn. Avoid having throw rugs at the top or bottom of the stairs. If you do have throw rugs, attach them to the floor with carpet tape. Make sure that you have a light  switch at the top of the stairs and the bottom of the stairs. If you do not have them, ask someone to add them for you. What else can I do to help prevent falls? Wear shoes that: Do not have high heels. Have rubber bottoms. Are comfortable and fit you well. Are closed at the toe. Do not wear sandals. If you use a stepladder: Make sure that it is fully opened. Do not climb a closed stepladder. Make sure that both sides of the stepladder are locked into place. Ask someone to hold it for you, if possible. Clearly mark and make sure that you can see: Any grab bars or handrails. First and last steps. Where the edge of each step is. Use tools that help you move around (mobility aids) if they are needed. These include: Canes. Walkers. Scooters. Crutches. Turn on the lights when you go into a dark area. Replace any light bulbs as soon as they burn out. Set up your furniture so you have a clear path. Avoid moving your furniture around. If any of your floors are uneven, fix them. If there are any pets around you, be aware of where they are. Review your medicines with your doctor. Some medicines can make you feel dizzy. This can increase your chance of falling. Ask your doctor what other things that you can do to help prevent falls. This information is not intended to replace advice given to you by your health care provider. Make sure you discuss any questions you have with your health care provider. Document Released: 10/23/2008 Document Revised: 06/04/2015 Document Reviewed: 01/31/2014 Elsevier Interactive Patient Education  2017 [employer].

## 2021-03-19 ENCOUNTER — Encounter: Payer: Self-pay | Admitting: Family Medicine

## 2021-03-19 ENCOUNTER — Ambulatory Visit (INDEPENDENT_AMBULATORY_CARE_PROVIDER_SITE_OTHER): Payer: Medicare Other | Admitting: Family Medicine

## 2021-03-19 ENCOUNTER — Other Ambulatory Visit: Payer: Self-pay | Admitting: Family Medicine

## 2021-03-19 VITALS — BP 137/96 | Ht 59.0 in | Wt 176.0 lb

## 2021-03-19 DIAGNOSIS — B369 Superficial mycosis, unspecified: Secondary | ICD-10-CM

## 2021-03-19 DIAGNOSIS — Q809 Congenital ichthyosis, unspecified: Secondary | ICD-10-CM

## 2021-03-19 DIAGNOSIS — E039 Hypothyroidism, unspecified: Secondary | ICD-10-CM | POA: Diagnosis not present

## 2021-03-19 DIAGNOSIS — K219 Gastro-esophageal reflux disease without esophagitis: Secondary | ICD-10-CM

## 2021-03-19 DIAGNOSIS — E7800 Pure hypercholesterolemia, unspecified: Secondary | ICD-10-CM | POA: Diagnosis not present

## 2021-03-19 DIAGNOSIS — Z0000 Encounter for general adult medical examination without abnormal findings: Secondary | ICD-10-CM

## 2021-03-19 MED ORDER — LEVOTHYROXINE SODIUM 100 MCG PO TABS: 100.0000 ug | ORAL_TABLET | Freq: Every day | ORAL | 3 refills | Status: DC

## 2021-03-19 MED ORDER — OMEPRAZOLE 40 MG PO CPDR: 40.0000 mg | DELAYED_RELEASE_CAPSULE | Freq: Every day | ORAL | 1 refills | Status: DC

## 2021-03-19 MED ORDER — UREA 40 % EX CREA: 1.0000 "application " | TOPICAL_CREAM | Freq: Every day | CUTANEOUS | 2 refills | Status: DC

## 2021-03-19 MED ORDER — COLESTIPOL HCL 1 G PO TABS: 2.0000 g | ORAL_TABLET | Freq: Every day | ORAL | 3 refills | Status: DC

## 2021-03-19 NOTE — Progress Notes (Signed)
? ?BP (!) 137/96   Ht 4' 11"  (1.499 m)   Wt 176 lb (79.8 kg)   SpO2 100%   BMI 35.55 kg/m?   ? ?Subjective:  ? ?Patient ID: Sara Mcgee, female    DOB: Jan 08, 1981, 41 y.o.   MRN: [id number] ? ?HPI: ?Sara Mcgee is a 41 y.o. female presenting on 03/19/2021 for Medical Management of Chronic Issues and Hypothyroidism ? ? ?HPI ?Hyperlipidemia ?Patient is coming in for recheck of his hyperlipidemia. The patient is currently taking Colestid. They deny any issues with myalgias or history of liver damage from it. They deny any focal numbness or weakness or chest pain.  ? ?Hypothyroidism recheck ?Patient is coming in for thyroid recheck today as well. They deny any issues with hair changes or heat or cold problems or diarrhea or constipation. They deny any chest pain or palpitations. They are currently on levothyroxine 100 micrograms  ? ?GERD ?Patient is currently on omeprazole.  She denies any major symptoms or abdominal pain or belching or burping. She denies any blood in her stool or lightheadedness or dizziness.  ? ?Relevant past medical, surgical, family and social history reviewed and updated as indicated. Interim medical history since our last visit reviewed. ?Allergies and medications reviewed and updated. ? ?Review of Systems  ?Constitutional:  Negative for chills and fever.  ?Eyes:  Negative for visual disturbance.  ?Respiratory:  Negative for chest tightness and shortness of breath.   ?Cardiovascular:  Negative for chest pain and leg swelling.  ?Musculoskeletal:  Negative for back pain and gait problem.  ?Skin:  Negative for rash.  ?Neurological:  Negative for dizziness, light-headedness and headaches.  ?Psychiatric/Behavioral:  Negative for agitation and behavioral problems.   ?All other systems reviewed and are negative. ? ?Per HPI unless specifically indicated above ? ? ?Allergies as of 03/19/2021   ? ?   Reactions  ? Adhesive [tape] Dermatitis, Other (See Comments)  ? Blistering rash from drape adhesive in  OR>    ? ?  ? ?  ?Medication List  ?  ? ?  ? Accurate as of March 19, 2021  2:10 PM. If you have any questions, ask your nurse or doctor.  ?  ?  ? ?  ? ?colestipol 1 g tablet ?Commonly known as: COLESTID ?Take 2 tablets (2 g total) by mouth daily. ?  ?levothyroxine 100 MCG tablet ?Commonly known as: SYNTHROID ?Take 1 tablet (100 mcg total) by mouth daily. ?  ?omeprazole 40 MG capsule ?Commonly known as: PRILOSEC ?Take 1 capsule (40 mg total) by mouth daily. ?  ?sodium fluoride 1.1 % Gel dental gel ?Commonly known as: FLUORISHIELD ?PLEASE SEE ATTACHED FOR DETAILED DIRECTIONS ?  ?urea 40 % Crea ?Commonly known as: CARMOL ?Apply 1 application. topically daily. ?  ? ?  ? ? ? ?Objective:  ? ?BP (!) 137/96   Ht 4' 11"  (1.499 m)   Wt 176 lb (79.8 kg)   SpO2 100%   BMI 35.55 kg/m?   ?Wt Readings from Last 3 Encounters:  ?03/19/21 176 lb (79.8 kg)  ?09/21/20 179 lb (81.2 kg)  ?07/17/20 177 lb (80.3 kg)  ?  ?Physical Exam ?Vitals and nursing note reviewed.  ?Constitutional:   ?   General: She is not in acute distress. ?   Appearance: She is well-developed. She is not diaphoretic.  ?Eyes:  ?   Conjunctiva/sclera: Conjunctivae normal.  ?Cardiovascular:  ?   Rate and Rhythm: Normal rate and regular rhythm.  ?  Heart sounds: Normal heart sounds. No murmur heard. ?Pulmonary:  ?   Effort: Pulmonary effort is normal. No respiratory distress.  ?   Breath sounds: Normal breath sounds. No wheezing.  ?Musculoskeletal:     ?   General: No swelling or tenderness. Normal range of motion.  ?Skin: ?   General: Skin is warm and dry.  ?   Findings: No rash.  ?Neurological:  ?   Mental Status: She is alert and oriented to person, place, and time.  ?   Coordination: Coordination normal.  ?Psychiatric:     ?   Behavior: Behavior normal.  ? ? ? ? ?Assessment & Plan:  ? ?Problem List Items Addressed This Visit   ? ?  ? Digestive  ? GERD (gastroesophageal reflux disease)  ? Relevant Medications  ? omeprazole (PRILOSEC) 40 MG capsule  ?  ?  Endocrine  ? Hypothyroidism  ? Relevant Medications  ? levothyroxine (SYNTHROID) 100 MCG tablet  ? Other Relevant Orders  ? CBC with Differential/Platelet  ? CMP14+EGFR  ? Lipid panel  ? TSH  ?  ? Other  ? HYPERCHOLESTEROLEMIA  ? Relevant Medications  ? colestipol (COLESTID) 1 g tablet  ? Other Relevant Orders  ? CBC with Differential/Platelet  ? CMP14+EGFR  ? Lipid panel  ? TSH  ? ?Other Visit Diagnoses   ? ? Physical exam    -  Primary  ? Fungal rash of torso      ? Relevant Medications  ? urea (CARMOL) 40 % CREA  ? ?  ?  ?Continue current medicine, will recheck thyroid. ?Follow up plan: ?Return in about 1 year (around 03/20/2022), or if symptoms worsen or fail to improve, for Cholesterol and thyroid and GERD. ? ?Counseling provided for all of the vaccine components ?Orders Placed This Encounter  ?Procedures  ? CBC with Differential/Platelet  ? CMP14+EGFR  ? Lipid panel  ? TSH  ? ? ?Caryl Pina, MD ?[address] ?03/19/2021, 2:10 PM ? ? ? ? ?

## 2021-03-20 LAB — CMP14+EGFR
ALT: 17 IU/L (ref 0–32)
AST: 18 IU/L (ref 0–40)
Albumin/Globulin Ratio: 1.2 (ref 1.2–2.2)
Albumin: 4 g/dL (ref 3.8–4.8)
Alkaline Phosphatase: 88 IU/L (ref 44–121)
BUN/Creatinine Ratio: 13 (ref 9–23)
BUN: 13 mg/dL (ref 6–24)
Bilirubin Total: 0.2 mg/dL (ref 0.0–1.2)
CO2: 25 mmol/L (ref 20–29)
Calcium: 9.2 mg/dL (ref 8.7–10.2)
Chloride: 103 mmol/L (ref 96–106)
Creatinine, Ser: 1.04 mg/dL — ABNORMAL HIGH (ref 0.57–1.00)
Globulin, Total: 3.3 g/dL (ref 1.5–4.5)
Glucose: 81 mg/dL (ref 70–99)
Potassium: 4.3 mmol/L (ref 3.5–5.2)
Sodium: 141 mmol/L (ref 134–144)
Total Protein: 7.3 g/dL (ref 6.0–8.5)
eGFR: 70 mL/min/{1.73_m2} (ref >59)

## 2021-03-20 LAB — CBC WITH DIFFERENTIAL/PLATELET
Basophils Absolute: 0.1 10*3/uL (ref 0.0–0.2)
Basos: 1 %
EOS (ABSOLUTE): 0.1 10*3/uL (ref 0.0–0.4)
Eos: 2 %
Hematocrit: 39.2 % (ref 34.0–46.6)
Hemoglobin: 12.1 g/dL (ref 11.1–15.9)
Immature Grans (Abs): 0 10*3/uL (ref 0.0–0.1)
Immature Granulocytes: 0 %
Lymphocytes Absolute: 1.6 10*3/uL (ref 0.7–3.1)
Lymphs: 32 %
MCH: 26.3 pg — ABNORMAL LOW (ref 26.6–33.0)
MCHC: 30.9 g/dL — ABNORMAL LOW (ref 31.5–35.7)
MCV: 85 fL (ref 79–97)
Monocytes Absolute: 0.3 10*3/uL (ref 0.1–0.9)
Monocytes: 6 %
Neutrophils Absolute: 3 10*3/uL (ref 1.4–7.0)
Neutrophils: 59 %
Platelets: 371 10*3/uL (ref 150–450)
RBC: 4.6 x10E6/uL (ref 3.77–5.28)
RDW: 16.7 % — ABNORMAL HIGH (ref 11.7–15.4)
WBC: 5 10*3/uL (ref 3.4–10.8)

## 2021-03-20 LAB — LIPID PANEL
Chol/HDL Ratio: 3.9 ratio (ref 0.0–4.4)
Cholesterol, Total: 174 mg/dL (ref 100–199)
HDL: 45 mg/dL (ref >39)
LDL Chol Calc (NIH): 108 mg/dL — ABNORMAL HIGH (ref 0–99)
Triglycerides: 119 mg/dL (ref 0–149)
VLDL Cholesterol Cal: 21 mg/dL (ref 5–40)

## 2021-03-20 LAB — TSH: TSH: 5.38 u[IU]/mL — ABNORMAL HIGH (ref 0.450–4.500)

## 2021-03-22 NOTE — Telephone Encounter (Signed)
Sent new cream because the urea says it was not covered by her insurance anymore, ?

## 2021-03-22 NOTE — Telephone Encounter (Signed)
Tried calling pts care giver. No answer and vmail not set up. ?

## 2021-03-24 ENCOUNTER — Encounter: Payer: Self-pay | Admitting: Family Medicine

## 2021-08-06 ENCOUNTER — Telehealth: Payer: Self-pay

## 2021-08-06 NOTE — Telephone Encounter (Signed)
-----   Message from Timothy Lasso, RN sent at 08/07/2019  9:11 AM EDT ----- Recall EUS in 2 years

## 2021-08-06 NOTE — Telephone Encounter (Signed)
Left message on machine to call back  

## 2021-08-10 NOTE — Telephone Encounter (Signed)
Left message on machine to call back  

## 2021-08-11 NOTE — Telephone Encounter (Signed)
Letter mailed to the pt home address. I have been unable to reach anyone at the phone number in the chart.

## 2021-09-23 ENCOUNTER — Other Ambulatory Visit: Payer: Self-pay | Admitting: Family Medicine

## 2021-09-23 DIAGNOSIS — K219 Gastro-esophageal reflux disease without esophagitis: Secondary | ICD-10-CM

## 2021-09-23 NOTE — Telephone Encounter (Signed)
Last office visit 03/19/21 Last refill 03/19/21, #90, 1 refill

## 2021-09-28 ENCOUNTER — Ambulatory Visit (INDEPENDENT_AMBULATORY_CARE_PROVIDER_SITE_OTHER): Payer: Medicare Other | Admitting: Internal Medicine

## 2021-09-28 ENCOUNTER — Encounter: Payer: Self-pay | Admitting: Internal Medicine

## 2021-09-28 VITALS — BP 90/70 | HR 67 | Ht <= 58 in | Wt 180.0 lb

## 2021-09-28 DIAGNOSIS — K529 Noninfective gastroenteritis and colitis, unspecified: Secondary | ICD-10-CM

## 2021-09-28 DIAGNOSIS — K219 Gastro-esophageal reflux disease without esophagitis: Secondary | ICD-10-CM | POA: Diagnosis not present

## 2021-09-28 DIAGNOSIS — D214 Benign neoplasm of connective and other soft tissue of abdomen: Secondary | ICD-10-CM | POA: Diagnosis not present

## 2021-09-28 NOTE — Progress Notes (Signed)
   Subjective:    Patient ID: Sara Mcgee, female    DOB: 1980/02/26, 41 y.o.   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  She is here today with her sister-in-law.  She has been doing very well.  She does intermittently have food transit slowly to the stomach.  The patient describes this as a "frog in her throat".  Not much heartburn or indigestion symptom.  No odynophagia.  No abdominal pain.  Takes omeprazole 40 mg daily.  Uses colestipol 2 g a day which helps her loose stools and keeps her stools formed.  No blood in stool or melena.  When she goes to stay with her parents she has a less discretionary diet and will have loose stools for several days and also gained a few pounds but diet is much more consistent and symptoms controlled when she is living with her sister-in-law.  No report of dementia symptoms, memory changes or mood changes.   Review of Systems As per HPI, otherwise negative  Current Medications, Allergies, Past Medical History, Past Surgical History, Family History and Social History were reviewed in [employer] record.    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   GERD/indigestion with dyspepsia --currently well controlled with omeprazole 40 mg daily.  Transient issues with swallowing may be due to esophageal dysmotility/poor clearance.  I doubt the gastric cyst/prior GIST is significantly contributing but cannot exclude this.  See #2. -- Continue omeprazole 40 mg daily  2.  Gastric GIST with prior resection/subepithelial lesion at the resection margin --surveillance EUS last in summer 2021.  We had discussed EUS this summer but with Dr. Ardis Hughs' absence and family desire to wait if we can I think it is reasonable to delay this to next summer -- Surveillance EUS recommended summer 2024  3.  Loose stools/prior cholecystectomy --well-controlled on colestipol continue 2 g daily  Follow-up in summer 2024  20 minutes total spent today including patient facing time, coordination of care, reviewing medical history/procedures/pertinent radiology studies, and documentation of the encounter.

## 2021-09-28 NOTE — Patient Instructions (Signed)
_______________________________________________________  If you are age 40 or older, your body mass index should be between 23-30. Your Body mass index is 41.84 kg/m. If this is out of the aforementioned range listed, please consider follow up with your Primary Care Provider.  If you are age 65 or younger, your body mass index should be between 19-25. Your Body mass index is 41.84 kg/m. If this is out of the aformentioned range listed, please consider follow up with your Primary Care Provider.   ________________________________________________________  The [facility] providers would like to encourage you to use [facility] to communicate with providers for non-urgent requests or questions.  Due to long hold times on the telephone, sending your provider a message by [facility] may be a faster and more efficient way to get a response.  Please allow 48 business hours for a response.  Please remember that this is for non-urgent requests.  _______________________________________________________  Please follow up in one year

## 2021-11-10 ENCOUNTER — Encounter: Payer: Self-pay | Admitting: Family Medicine

## 2021-12-22 ENCOUNTER — Ambulatory Visit (INDEPENDENT_AMBULATORY_CARE_PROVIDER_SITE_OTHER): Payer: Medicare Other

## 2021-12-22 VITALS — Ht <= 58 in | Wt 180.0 lb

## 2021-12-22 DIAGNOSIS — Z0000 Encounter for general adult medical examination without abnormal findings: Secondary | ICD-10-CM | POA: Diagnosis not present

## 2021-12-22 NOTE — Patient Instructions (Signed)
Sara Mcgee , Thank you for taking time to come for your Medicare Wellness Visit. I appreciate your ongoing commitment to your health goals. Please review the following plan we discussed and let me know if I can assist you in the future.   These are the goals we discussed:  Goals      Exercise 2-3x per week (30 min per time)     Increase walking to 30 minutes 2-3 times per week.     Patient Stated     04/05/2019 AWV Goal: Exercise for General Health  Patient will verbalize understanding of the benefits of increased physical activity: Exercising regularly is important. It will improve your overall fitness, flexibility, and endurance. Regular exercise also will improve your overall health. It can help you control your weight, reduce stress, and improve your bone density. Over the next year, patient will increase physical activity as tolerated with a goal of at least 150 minutes of moderate physical activity per week.  You can tell that you are exercising at a moderate intensity if your heart starts beating faster and you start breathing faster but can still hold a conversation. Moderate-intensity exercise ideas include: Walking 1 mile (1.6 km) in about 15 minutes Biking Hiking Golfing Dancing Water aerobics Patient will verbalize understanding of everyday activities that increase physical activity by providing examples like the following: Yard work, such as: Sales promotion account executive Gardening Washing windows or floors Patient will be able to explain general safety guidelines for exercising:  Before you start a new exercise program, talk with your health care provider. Do not exercise so much that you hurt yourself, feel dizzy, or get very short of breath. Wear comfortable clothes and wear shoes with good support. Drink plenty of water while you exercise to prevent dehydration or heat stroke. Work out until your  breathing and your heartbeat get faster.         This is a list of the screening recommended for you and due dates:  Health Maintenance  Topic Date Due   DTaP/Tdap/Td vaccine (1 - Tdap) Never done   Pap Smear  Never done   COVID-19 Vaccine (4 - 2023-24 season) 09/10/2021   Hepatitis C Screening: USPSTF Recommendation to screen - Ages 18-79 yo.  03/20/2022*   HIV Screening  03/20/2022*   Medicare Annual Wellness Visit  12/23/2022   Flu Shot  Completed   HPV Vaccine  Aged Out  *Topic was postponed. The date shown is not the original due date.    Advanced directives: Please bring a copy of your health care power of attorney and living will to the office to be added to your chart at your convenience.   Conditions/risks identified: Aim for 30 minutes of exercise or brisk walking, 6-8 glasses of water, and 5 servings of fruits and vegetables each day.   Next appointment: Follow up in one year for your annual wellness visit.   Preventive Care 40-64 Years, Female Preventive care refers to lifestyle choices and visits with your health care provider that can promote health and wellness. What does preventive care include? A yearly physical exam. This is also called an annual well check. Dental exams once or twice a year. Routine eye exams. Ask your health care provider how often you should have your eyes checked. Personal lifestyle choices, including: Daily care of your teeth and gums. Regular physical activity. Eating a healthy diet. Avoiding tobacco and drug  use. Limiting alcohol use. Practicing safe sex. Taking low-dose aspirin daily starting at age 80. Taking vitamin and mineral supplements as recommended by your health care provider. What happens during an annual well check? The services and screenings done by your health care provider during your annual well check will depend on your age, overall health, lifestyle risk factors, and family history of disease. Counseling  Your  health care provider may ask you questions about your: Alcohol use. Tobacco use. Drug use. Emotional well-being. Home and relationship well-being. Sexual activity. Eating habits. Work and work Statistician. Method of birth control. Menstrual cycle. Pregnancy history. Screening  You may have the following tests or measurements: Height, weight, and BMI. Blood pressure. Lipid and cholesterol levels. These may be checked every 5 years, or more frequently if you are over 75 years old. Skin check. Lung cancer screening. You may have this screening every year starting at age 53 if you have a 30-pack-year history of smoking and currently smoke or have quit within the past 15 years. Fecal occult blood test (FOBT) of the stool. You may have this test every year starting at age 36. Flexible sigmoidoscopy or colonoscopy. You may have a sigmoidoscopy every 5 years or a colonoscopy every 10 years starting at age 49. Hepatitis C blood test. Hepatitis B blood test. Sexually transmitted disease (STD) testing. Diabetes screening. This is done by checking your blood sugar (glucose) after you have not eaten for a while (fasting). You may have this done every 1-3 years. Mammogram. This may be done every 1-2 years. Talk to your health care provider about when you should start having regular mammograms. This may depend on whether you have a family history of breast cancer. BRCA-related cancer screening. This may be done if you have a family history of breast, ovarian, tubal, or peritoneal cancers. Pelvic exam and Pap test. This may be done every 3 years starting at age 11. Starting at age 4, this may be done every 5 years if you have a Pap test in combination with an HPV test. Bone density scan. This is done to screen for osteoporosis. You may have this scan if you are at high risk for osteoporosis. Discuss your test results, treatment options, and if necessary, the need for more tests with your health care  provider. Vaccines  Your health care provider may recommend certain vaccines, such as: Influenza vaccine. This is recommended every year. Tetanus, diphtheria, and acellular pertussis (Tdap, Td) vaccine. You may need a Td booster every 10 years. Zoster vaccine. You may need this after age 65. Pneumococcal 13-valent conjugate (PCV13) vaccine. You may need this if you have certain conditions and were not previously vaccinated. Pneumococcal polysaccharide (PPSV23) vaccine. You may need one or two doses if you smoke cigarettes or if you have certain conditions. Talk to your health care provider about which screenings and vaccines you need and how often you need them. This information is not intended to replace advice given to you by your health care provider. Make sure you discuss any questions you have with your health care provider. Document Released: 01/23/2015 Document Revised: 09/16/2015 Document Reviewed: 10/28/2014 Elsevier Interactive Patient Education  2017 [place] Prevention in the Home Falls can cause injuries. They can happen to people of all ages. There are many things you can do to make your home safe and to help prevent falls. What can I do on the outside of my home? Regularly fix the edges of walkways and  driveways and fix any cracks. Remove anything that might make you trip as you walk through a door, such as a raised step or threshold. Trim any bushes or trees on the path to your home. Use bright outdoor lighting. Clear any walking paths of anything that might make someone trip, such as rocks or tools. Regularly check to see if handrails are loose or broken. Make sure that both sides of any steps have handrails. Any raised decks and porches should have guardrails on the edges. Have any leaves, snow, or ice cleared regularly. Use sand or salt on walking paths during winter. Clean up any spills in your garage right away. This includes oil or grease spills. What  can I do in the bathroom? Use night lights. Install grab bars by the toilet and in the tub and shower. Do not use towel bars as grab bars. Use non-skid mats or decals in the tub or shower. If you need to sit down in the shower, use a plastic, non-slip stool. Keep the floor dry. Clean up any water that spills on the floor as soon as it happens. Remove soap buildup in the tub or shower regularly. Attach bath mats securely with double-sided non-slip rug tape. Do not have throw rugs and other things on the floor that can make you trip. What can I do in the bedroom? Use night lights. Make sure that you have a light by your bed that is easy to reach. Do not use any sheets or blankets that are too big for your bed. They should not hang down onto the floor. Have a firm chair that has side arms. You can use this for support while you get dressed. Do not have throw rugs and other things on the floor that can make you trip. What can I do in the kitchen? Clean up any spills right away. Avoid walking on wet floors. Keep items that you use a lot in easy-to-reach places. If you need to reach something above you, use a strong step stool that has a grab bar. Keep electrical cords out of the way. Do not use floor polish or wax that makes floors slippery. If you must use wax, use non-skid floor wax. Do not have throw rugs and other things on the floor that can make you trip. What can I do with my stairs? Do not leave any items on the stairs. Make sure that there are handrails on both sides of the stairs and use them. Fix handrails that are broken or loose. Make sure that handrails are as long as the stairways. Check any carpeting to make sure that it is firmly attached to the stairs. Fix any carpet that is loose or worn. Avoid having throw rugs at the top or bottom of the stairs. If you do have throw rugs, attach them to the floor with carpet tape. Make sure that you have a light switch at the top of the  stairs and the bottom of the stairs. If you do not have them, ask someone to add them for you. What else can I do to help prevent falls? Wear shoes that: Do not have high heels. Have rubber bottoms. Are comfortable and fit you well. Are closed at the toe. Do not wear sandals. If you use a stepladder: Make sure that it is fully opened. Do not climb a closed stepladder. Make sure that both sides of the stepladder are locked into place. Ask someone to hold it for you, if possible. Clearly  mark and make sure that you can see: Any grab bars or handrails. First and last steps. Where the edge of each step is. Use tools that help you move around (mobility aids) if they are needed. These include: Canes. Walkers. Scooters. Crutches. Turn on the lights when you go into a dark area. Replace any light bulbs as soon as they burn out. Set up your furniture so you have a clear path. Avoid moving your furniture around. If any of your floors are uneven, fix them. If there are any pets around you, be aware of where they are. Review your medicines with your doctor. Some medicines can make you feel dizzy. This can increase your chance of falling. Ask your doctor what other things that you can do to help prevent falls. This information is not intended to replace advice given to you by your health care provider. Make sure you discuss any questions you have with your health care provider. Document Released: 10/23/2008 Document Revised: 06/04/2015 Document Reviewed: 01/31/2014 Elsevier Interactive Patient Education  2017 [employer].

## 2021-12-22 NOTE — Progress Notes (Signed)
Subjective:   Sara Mcgee is a 41 y.o. female who presents for Medicare Annual (Subsequent) preventive examination.  Review of Systems     Cardiac Risk Factors include: obesity (BMI >30kg/m2);dyslipidemia     Objective:    Today's Vitals   12/22/21 1434  Weight: 180 lb (81.6 kg)  Height: '4\' 7"'$  (1.397 m)   Body mass index is 41.84 kg/m.     12/22/2021    2:39 PM 08/01/2019    8:27 AM 04/05/2019    9:35 AM 04/27/2017    6:37 AM 04/21/2017    2:57 PM 04/21/2017    2:47 PM 02/15/2017    4:31 PM  Advanced Directives  Does Patient Have a Medical Advance Directive? Yes Yes Yes Yes Yes Yes Yes  Type of Advance Directive Living will;Healthcare Power of [place] of [place] of [place] of [place]  Does patient want to make changes to medical advance directive? No - Patient declined  No - Patient declined   No - Patient declined No - Patient declined  Copy of [place] in Chart? No - copy requested No - copy requested No - copy requested No - copy requested  No - copy requested No - copy requested  Would patient like information on creating a medical advance directive?      No - Patient declined     Current Medications (verified) Outpatient Encounter Medications as of 12/22/2021  Medication Sig   colestipol (COLESTID) 1 g tablet Take 2 tablets (2 g total) by mouth daily.   levothyroxine (SYNTHROID) 100 MCG tablet Take 1 tablet (100 mcg total) by mouth daily.   omeprazole (PRILOSEC) 40 MG capsule TAKE 1 CAPSULE (40 MG TOTAL) BY MOUTH DAILY.   salicylic acid-lactic acid 17 % external solution Apply topically daily.   sodium fluoride (FLUORISHIELD) 1.1 % GEL dental gel PLEASE SEE ATTACHED FOR DETAILED DIRECTIONS   No facility-administered encounter medications on file as of 12/22/2021.    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)    GERD (gastroesophageal reflux disease)    Hard of hearing    Hepatic steatosis    Hypothyroidism    PONV (postoperative nausea and vomiting)    after [facility] endo at [facility]   Past Surgical History:  Procedure Laterality Date   CHOLECYSTECTOMY N/A 04/23/2015   Procedure: LAPAROSCOPIC CHOLECYSTECTOMY WITH INTRAOPERATIVE CHOLANGIOGRAM;  Surgeon: Stark Klein, MD;  Location: [facility];  Service: General;  Laterality: N/A;   ENDOSCOPIC RETROGRADE CHOLANGIOPANCREATOGRAPHY (ERCP) WITH PROPOFOL N/A 02/26/2015   Procedure: ENDOSCOPIC RETROGRADE CHOLANGIOPANCREATOGRAPHY (ERCP) WITH PROPOFOL;  Surgeon: [name], MD;  Location: [facility];  Service: Endoscopy;  Laterality: N/A;   ESOPHAGOGASTRODUODENOSCOPY N/A 04/23/2015   Procedure: ESOPHAGOGASTRODUODENOSCOPY (EGD);  Surgeon: Stark Klein, MD;  Location: [facility];  Service: General;  Laterality: N/A;   ESOPHAGOGASTRODUODENOSCOPY (EGD) WITH PROPOFOL N/A 08/01/2019   Procedure: ESOPHAGOGASTRODUODENOSCOPY (EGD) WITH PROPOFOL;  Surgeon: [name], MD;  Location: [facility];  Service: Endoscopy;  Laterality: N/A;  EUS N/A 02/26/2015   Procedure: UPPER ENDOSCOPIC ULTRASOUND (EUS) LINEAR;  Surgeon: [name], MD;  Location: [facility];  Service: Endoscopy;  Laterality: N/A;   EUS N/A 05/05/2016   Procedure: UPPER ENDOSCOPIC ULTRASOUND (EUS) LINEAR;  Surgeon: [name], MD;  Location: [facility];  Service: Endoscopy;  Laterality: N/A;  General?   EUS N/A 04/27/2017   Procedure: UPPER ENDOSCOPIC ULTRASOUND (EUS) RADIAL;  Surgeon: [name], MD;  Location: [facility];  Service: Endoscopy;  Laterality: N/A;   EUS N/A 08/01/2019    Procedure: UPPER ENDOSCOPIC ULTRASOUND (EUS) RADIAL;  Surgeon: [name], MD;  Location: [facility];  Service: Endoscopy;  Laterality: N/A;   FINE NEEDLE ASPIRATION N/A 08/01/2019   Procedure: FINE NEEDLE ASPIRATION (FNA) LINEAR;  Surgeon: [name], MD;  Location: [facility];  Service: Endoscopy;  Laterality: N/A;   LAPAROSCOPIC GASTRIC RESECTION N/A 04/23/2015   Procedure: LAPAROSCOPIC PARTIAL GASTRECTOMY;  Surgeon: Stark Klein, MD;  Location: [facility];  Service: General;  Laterality: N/A;   NASAL ENDOSCOPY     TONSILLECTOMY     WISDOM TOOTH EXTRACTION     Family History  Problem Relation Age of Onset   Hypertension Mother    Diabetes Mother    Kidney disease Mother        Stage 2   Dementia Mother    Hypertension Father    Heart disease Father    Cancer Father        lymphoma   Diabetes Maternal Grandmother    Colon cancer Neg Hx    Colon polyps Neg Hx    Esophageal cancer Neg Hx    Social History   Socioeconomic History   Marital status: Single    Spouse name: Not on file   Number of children: 0   Years of education: 12   Highest education level: 12th grade  Occupational History   Occupation: disability  Tobacco Use   Smoking status: Never   Smokeless tobacco: Never  Vaping Use   Vaping Use: Never used  Substance and Sexual Activity   Alcohol use: No    Alcohol/week: 0.0 standard drinks of alcohol   Drug use: No   Sexual activity: Never  Other Topics Concern   Not on file  Social History Narrative   Down Syndrome   She lives with her sister, Sherlyn Hay and brother in law   Her parents live 2 houses down   Social Determinants of Health   Financial Resource Strain: Low Risk  (12/22/2021)   Overall Financial Resource Strain (CARDIA)    Difficulty of Paying Living Expenses: Not hard at all  Food Insecurity: No Food Insecurity (12/22/2021)   Hunger Vital Sign    Worried About Running Out of Food in the Last Year: Never true    Ran Out of Food in  the Last Year: Never true  Transportation Needs: No Transportation Needs (12/22/2021)   PRAPARE - Hydrologist (Medical): No    Lack of Transportation (Non-Medical): No  Physical Activity: Sufficiently Active (12/22/2021)   Exercise Vital Sign    Days of Exercise per Week: 5 days    Minutes of Exercise per Session: 30 min  Stress: No Stress Concern Present (12/22/2021)   [place]    Feeling of Stress : Not at all  Social Connections: Moderately Integrated (12/22/2021)   Social Connection and Isolation Panel [NHANES]    Frequency of Communication  with Friends and Family: More than three times a week    Frequency of Social Gatherings with Friends and Family: Three times a week    Attends Religious Services: More than 4 times per year    Active Member of Clubs or Organizations: Yes    Attends Music therapist: More than 4 times per year    Marital Status: Never married    Tobacco Counseling Counseling given: Not Answered   Clinical Intake:  Pre-visit preparation completed: Yes  Pain : No/denies pain     BMI - recorded: 41.84 Nutritional Status: BMI > 30  Obese Nutritional Risks: None Diabetes: No  How often do you need to have someone help you when you read instructions, pamphlets, or other written materials from your doctor or pharmacy?: 4 - Often  Diabetic?No   Interpreter Needed?: No  Information entered by :: Denman George LPN   Activities of Daily Living    12/22/2021    2:39 PM  In your present state of health, do you have any difficulty performing the following activities:  Hearing? 0  Vision? 0  Difficulty concentrating or making decisions? 1  Walking or climbing stairs? 0  Dressing or bathing? 0  Doing errands, shopping? 1  Preparing Food and eating ? N  Using the Toilet? N  In the past six months, have you accidently leaked urine? N  Do you  have problems with loss of bowel control? N  Managing your Medications? Y  Managing your Finances? Y  Housekeeping or managing your Housekeeping? Y    Patient Care Team: Dettinger, Fransisca Kaufmann, MD as PCP - General (Family Medicine) Pyrtle, Lajuan Lines, MD as Consulting Physician (Gastroenterology)  Indicate any recent Medical Services you may have received from other than [facility] providers in the past year (date may be approximate).     Assessment:   This is a routine wellness examination for [place].  Hearing/Vision screen Hearing Screening - Comments:: Followed by ENT Vision Screening - Comments:: up to date with routine eye exams with [place] issues and exercise activities discussed: Current Exercise Habits: Home exercise routine, Type of exercise: walking, Time (Minutes): 30, Frequency (Times/Week): 5, Weekly Exercise (Minutes/Week): 150, Intensity: Mild   Goals Addressed   None   Depression Screen    12/22/2021    2:38 PM 09/21/2020    3:31 PM 03/18/2020    2:21 PM 09/18/2019   10:22 AM 03/18/2019   10:00 AM 02/19/2018    1:51 PM 10/26/2017    1:27 PM  PHQ 2/9 Scores  PHQ - 2 Score 0 0 0 0 0 0 0    Fall Risk    12/22/2021    2:36 PM 09/21/2020    3:33 PM 03/18/2020    2:21 PM 09/18/2019   10:22 AM 03/18/2019   10:00 AM  [place] in the past year? 0 0 0 0 0  Number falls in past yr: 0 0     Injury with Fall? 0 0     Risk for fall due to : No Fall Risks Impaired vision     Follow up Falls evaluation completed;Education provided;Falls prevention discussed Falls prevention discussed       FALL RISK PREVENTION PERTAINING TO THE HOME:  Any stairs in or around the home? Yes  If so, are there any without handrails? No  Home free of loose throw rugs in walkways, pet beds, electrical cords, etc? Yes  Adequate  lighting in your home to reduce risk of falls? Yes   ASSISTIVE DEVICES UTILIZED TO PREVENT FALLS:  Life alert? No  Use of a cane, walker or w/c? No   Grab bars in the bathroom? Yes  Shower chair or bench in shower? No  Elevated toilet seat or a handicapped toilet? Yes   TIMED UP AND GO:  Was the test performed? No .  Length of time to ambulate 10 feet: telephonic visit    Cognitive Function:    09/21/2020    3:39 PM 04/05/2019    9:44 AM 02/15/2017    4:28 PM  MMSE - Mini Mental State Exam  Not completed: Unable to complete Unable to complete Unable to complete        12/22/2021    2:40 PM  6CIT Screen  What Year? 0 points  What month? 0 points  What time? 0 points  Count back from 20 0 points  Months in reverse 2 points  Repeat phrase 4 points  Total Score 6 points    Immunizations Immunization History  Administered Date(s) Administered   Influenza,inj,Quad PF,6+ Mos 11/05/2012, 10/31/2014, 10/08/2015, 11/15/2016, 10/26/2017, 11/01/2018, 11/13/2019   Influenza-Unspecified 10/10/2013   PFIZER(Purple Top)SARS-COV-2 Vaccination 04/03/2019, 04/24/2019, 11/25/2019    TDAP status: Due, Education has been provided regarding the importance of this vaccine. Advised may receive this vaccine at local pharmacy or Health Dept. Aware to provide a copy of the vaccination record if obtained from local pharmacy or Health Dept. Verbalized acceptance and understanding.  Flu Vaccine status: Up to date  Pneumococcal vaccine status: Up to date  Covid-19 vaccine status: Information provided on how to obtain vaccines.   Qualifies for Shingles Vaccine? No     Screening Tests Health Maintenance  Topic Date Due   DTaP/Tdap/Td (1 - Tdap) Never done   PAP SMEAR-Modifier  Never done   COVID-19 Vaccine (4 - 2023-24 season) 09/10/2021   Hepatitis C Screening  03/20/2022 (Originally 02/14/1999)   HIV Screening  03/20/2022 (Originally 02/14/1996)   Medicare Annual Wellness (AWV)  12/23/2022   INFLUENZA VACCINE  Completed   HPV VACCINES  Aged Out    Health Maintenance  Health Maintenance Due  Topic Date Due   DTaP/Tdap/Td (1 - Tdap)  Never done   PAP SMEAR-Modifier  Never done   COVID-19 Vaccine (4 - 2023-24 season) 09/10/2021    Additional Screening:  Hepatitis C Screening: does not qualify  Vision Screening: Recommended annual ophthalmology exams for early detection of glaucoma and other disorders of the eye. Is the patient up to date with their annual eye exam?  Yes  Who is the provider or what is the name of the office in which the patient attends annual eye exams? [place]  If pt is not established with a provider, would they like to be referred to a provider to establish care? No .   Dental Screening: Recommended annual dental exams for proper oral hygiene  Community Resource Referral / Chronic Care Management: CRR required this visit?  No   CCM required this visit?  No      Plan:     I have personally reviewed and noted the following in the patient's chart:   Medical and social history Use of alcohol, tobacco or illicit drugs  Current medications and supplements including opioid prescriptions. Patient is not currently taking opioid prescriptions. Functional ability and status Nutritional status Physical activity Advanced directives List of other physicians Hospitalizations, surgeries, and ER visits in previous 12 months  Vitals Screenings to include cognitive, depression, and falls Referrals and appointments  In addition, I have reviewed and discussed with patient certain preventive protocols, quality metrics, and best practice recommendations. A written personalized care plan for preventive services as well as general preventive health recommendations were provided to patient.     Vanetta Mulders, [place]   [id number]/01/[id number]   Due to this being a virtual visit, the after visit summary with patients personalized plan was offered to patient via mail or my-chart. per request, patient was mailed a copy of AVS./  Nurse Notes: Requesting referral to a new ENT that is located closer, current  provider recently moved to [facility]

## 2021-12-29 ENCOUNTER — Other Ambulatory Visit: Payer: Self-pay | Admitting: Family Medicine

## 2021-12-29 DIAGNOSIS — E7800 Pure hypercholesterolemia, unspecified: Secondary | ICD-10-CM

## 2022-01-05 ENCOUNTER — Encounter: Payer: Self-pay | Admitting: Family Medicine

## 2022-01-05 ENCOUNTER — Other Ambulatory Visit: Payer: Self-pay | Admitting: Family Medicine

## 2022-01-05 DIAGNOSIS — E7800 Pure hypercholesterolemia, unspecified: Secondary | ICD-10-CM

## 2022-01-05 MED ORDER — COLESTIPOL HCL 1 G PO TABS: 2.0000 g | ORAL_TABLET | Freq: Every day | ORAL | 3 refills | Status: DC

## 2022-01-07 NOTE — Progress Notes (Signed)
I connected with  Sara Mcgee on 12/22/21 by a audio enabled telemedicine application and verified that I am speaking with the correct person using two identifiers.  Patient Location: Home  Provider Location: Home Office  I discussed the limitations of evaluation and management by telemedicine. The patient expressed understanding and agreed to proceed.

## 2022-02-16 ENCOUNTER — Encounter: Payer: Self-pay | Admitting: Family Medicine

## 2022-03-21 ENCOUNTER — Encounter: Payer: Self-pay | Admitting: Family Medicine

## 2022-03-21 ENCOUNTER — Ambulatory Visit (INDEPENDENT_AMBULATORY_CARE_PROVIDER_SITE_OTHER): Payer: Medicare Other | Admitting: Family Medicine

## 2022-03-21 VITALS — BP 122/80 | HR 71 | Ht <= 58 in | Wt 176.0 lb

## 2022-03-21 DIAGNOSIS — H6123 Impacted cerumen, bilateral: Secondary | ICD-10-CM

## 2022-03-21 DIAGNOSIS — K219 Gastro-esophageal reflux disease without esophagitis: Secondary | ICD-10-CM

## 2022-03-21 DIAGNOSIS — E039 Hypothyroidism, unspecified: Secondary | ICD-10-CM

## 2022-03-21 DIAGNOSIS — Q909 Down syndrome, unspecified: Secondary | ICD-10-CM

## 2022-03-21 DIAGNOSIS — E7800 Pure hypercholesterolemia, unspecified: Secondary | ICD-10-CM

## 2022-03-21 MED ORDER — OMEPRAZOLE 40 MG PO CPDR: 40.0000 mg | DELAYED_RELEASE_CAPSULE | Freq: Every day | ORAL | 3 refills | Status: DC

## 2022-03-21 MED ORDER — LEVOTHYROXINE SODIUM 100 MCG PO TABS: 100.0000 ug | ORAL_TABLET | Freq: Every day | ORAL | 3 refills | Status: DC

## 2022-03-21 NOTE — Progress Notes (Signed)
BP 122/80   Pulse 71   Ht '4\' 9"'$  (1.448 m)   Wt 176 lb (79.8 kg)   SpO2 99%   BMI 38.09 kg/m    Subjective:   Patient ID: Sara Mcgee, female    DOB: 1980/02/24, 42 y.o.   MRN: [id number]  HPI: NADINA EDIE is a 42 y.o. female presenting on 03/21/2022 for Medical Management of Chronic Issues (CPE), Rash (Face, chest and back), and Ear Fullness (Would like to see an ENT)   HPI Hypothyroidism recheck Patient is coming in for thyroid recheck today as well. They deny any issues with hair changes or heat or cold problems or diarrhea or constipation. They deny any chest pain or palpitations. They are currently on levothyroxine 100 micrograms   Hyperlipidemia Patient is coming in for recheck of his hyperlipidemia. The patient is currently taking colestipol. They deny any issues with myalgias or history of liver damage from it. They deny any focal numbness or weakness or chest pain.   GERD Patient is currently on omeprazole.  She denies any major symptoms or abdominal pain or belching or burping. She denies any blood in her stool or lightheadedness or dizziness.   Down syndrome Patient has down syndrome and mild dementia starting with it.  She has a caretaker and seems to be doing well with her caretaker.  She does complain of congested ears and has had problems with her ear for and used to have an ENT but they moved and so she needs an ENT.  Relevant past medical, surgical, family and social history reviewed and updated as indicated. Interim medical history since our last visit reviewed. Allergies and medications reviewed and updated.  Review of Systems  Constitutional:  Negative for chills and fever.  Eyes:  Negative for visual disturbance.  Respiratory:  Negative for chest tightness and shortness of breath.   Cardiovascular:  Negative for chest pain and leg swelling.  Musculoskeletal:  Negative for back pain and gait problem.  Skin:  Positive for rash.  Neurological:  Negative  for dizziness, light-headedness and headaches.  Psychiatric/Behavioral:  Negative for agitation and behavioral problems.   All other systems reviewed and are negative.   Per HPI unless specifically indicated above   Allergies as of 03/21/2022       Reactions   Adhesive [tape] Dermatitis, Other (See Comments)   Blistering rash from drape adhesive in OR>          Medication List        Accurate as of March 21, 2022  1:21 PM. If you have any questions, ask your nurse or doctor.          colestipol 1 g tablet Commonly known as: COLESTID Take 2 tablets (2 g total) by mouth daily.   levothyroxine 100 MCG tablet Commonly known as: SYNTHROID Take 1 tablet (100 mcg total) by mouth daily.   omeprazole 40 MG capsule Commonly known as: PRILOSEC Take 1 capsule (40 mg total) by mouth daily.   salicylic acid-lactic acid 17 % external solution Apply topically daily.   sodium fluoride 1.1 % Gel dental gel Commonly known as: FLUORISHIELD PLEASE SEE ATTACHED FOR DETAILED DIRECTIONS         Objective:   BP 122/80   Pulse 71   Ht '4\' 9"'$  (1.448 m)   Wt 176 lb (79.8 kg)   SpO2 99%   BMI 38.09 kg/m   Wt Readings from Last 3 Encounters:  03/21/22 176 lb (79.8 kg)  12/22/21 180 lb (81.6 kg)  09/28/21 180 lb (81.6 kg)    Physical Exam Vitals and nursing note reviewed.  Constitutional:      General: She is not in acute distress.    Appearance: She is well-developed. She is not diaphoretic.  HENT:     Right Ear: There is impacted cerumen.     Left Ear: There is impacted cerumen.  Eyes:     Conjunctiva/sclera: Conjunctivae normal.  Cardiovascular:     Rate and Rhythm: Normal rate and regular rhythm.     Heart sounds: Normal heart sounds. No murmur heard. Pulmonary:     Effort: Pulmonary effort is normal. No respiratory distress.     Breath sounds: Normal breath sounds. No wheezing.  Musculoskeletal:        General: No swelling or tenderness. Normal range of motion.   Skin:    General: Skin is warm and dry.     Findings: Rash present.     Comments: Patient has rash on her face which is the one that she fights her currently, she still has an irritated rash which is fine papular on her chest as well. She has diffuse dots that are circular scaly patches, 1 on her right breast and 1 on her right shoulder and 1 on her left shoulder that are consistent with an fungal  Neurological:     Mental Status: She is alert and oriented to person, place, and time.     Coordination: Coordination normal.  Psychiatric:        Behavior: Behavior normal.     Nurse to lavage cerumen, patient had some pain and was not able to fully tolerate.  Assessment & Plan:   Problem List Items Addressed This Visit       Digestive   GERD (gastroesophageal reflux disease)   Relevant Medications   omeprazole (PRILOSEC) 40 MG capsule   Other Relevant Orders   CBC with Differential/Platelet   CMP14+EGFR     Endocrine   Hypothyroidism - Primary   Relevant Medications   levothyroxine (SYNTHROID) 100 MCG tablet   Other Relevant Orders   CBC with Differential/Platelet   TSH     Other   HYPERCHOLESTEROLEMIA   Relevant Orders   CBC with Differential/Platelet   CMP14+EGFR   Lipid panel   DOWN SYNDROME   Other Visit Diagnoses     Bilateral impacted cerumen       Relevant Orders   Ambulatory referral to ENT     Will do blood work today.  No change in medication, did cerumen lavage, will refer to ENT because it is a recurrent problem and because she has tubes.  Follow up plan: Return in about 6 months (around 09/21/2022), or if symptoms worsen or fail to improve, for Hypothyroidism and hyperlipidemia.  Counseling provided for all of the vaccine components Orders Placed This Encounter  Procedures   CBC with Differential/Platelet   CMP14+EGFR   Lipid panel   TSH   Ambulatory referral to ENT    Caryl Pina, MD [facility] 03/21/2022, 1:21  PM

## 2022-03-22 LAB — CMP14+EGFR
ALT: 18 IU/L (ref 0–32)
AST: 19 IU/L (ref 0–40)
Albumin/Globulin Ratio: 1.2 (ref 1.2–2.2)
Albumin: 4.1 g/dL (ref 3.9–4.9)
Alkaline Phosphatase: 97 IU/L (ref 44–121)
BUN/Creatinine Ratio: 11 (ref 9–23)
BUN: 12 mg/dL (ref 6–24)
Bilirubin Total: 0.4 mg/dL (ref 0.0–1.2)
CO2: 24 mmol/L (ref 20–29)
Calcium: 9.1 mg/dL (ref 8.7–10.2)
Chloride: 104 mmol/L (ref 96–106)
Creatinine, Ser: 1.11 mg/dL — ABNORMAL HIGH (ref 0.57–1.00)
Globulin, Total: 3.4 g/dL (ref 1.5–4.5)
Glucose: 88 mg/dL (ref 70–99)
Potassium: 4.8 mmol/L (ref 3.5–5.2)
Sodium: 142 mmol/L (ref 134–144)
Total Protein: 7.5 g/dL (ref 6.0–8.5)
eGFR: 64 mL/min/{1.73_m2} (ref >59)

## 2022-03-22 LAB — TSH: TSH: 0.668 u[IU]/mL (ref 0.450–4.500)

## 2022-03-22 LAB — LIPID PANEL
Chol/HDL Ratio: 4.2 ratio (ref 0.0–4.4)
Cholesterol, Total: 183 mg/dL (ref 100–199)
HDL: 44 mg/dL (ref >39)
LDL Chol Calc (NIH): 117 mg/dL — ABNORMAL HIGH (ref 0–99)
Triglycerides: 121 mg/dL (ref 0–149)
VLDL Cholesterol Cal: 22 mg/dL (ref 5–40)

## 2022-03-22 LAB — CBC WITH DIFFERENTIAL/PLATELET
Basophils Absolute: 0 10*3/uL (ref 0.0–0.2)
Basos: 1 %
EOS (ABSOLUTE): 0.1 10*3/uL (ref 0.0–0.4)
Eos: 1 %
Hematocrit: 38.2 % (ref 34.0–46.6)
Hemoglobin: 11.8 g/dL (ref 11.1–15.9)
Immature Grans (Abs): 0 10*3/uL (ref 0.0–0.1)
Immature Granulocytes: 0 %
Lymphocytes Absolute: 1.4 10*3/uL (ref 0.7–3.1)
Lymphs: 32 %
MCH: 26.2 pg — ABNORMAL LOW (ref 26.6–33.0)
MCHC: 30.9 g/dL — ABNORMAL LOW (ref 31.5–35.7)
MCV: 85 fL (ref 79–97)
Monocytes Absolute: 0.3 10*3/uL (ref 0.1–0.9)
Monocytes: 8 %
Neutrophils Absolute: 2.6 10*3/uL (ref 1.4–7.0)
Neutrophils: 58 %
Platelets: 383 10*3/uL (ref 150–450)
RBC: 4.5 x10E6/uL (ref 3.77–5.28)
RDW: 17.3 % — ABNORMAL HIGH (ref 11.7–15.4)
WBC: 4.4 10*3/uL (ref 3.4–10.8)

## 2022-09-20 ENCOUNTER — Other Ambulatory Visit: Payer: Self-pay | Admitting: Nurse Practitioner

## 2022-09-20 DIAGNOSIS — E7800 Pure hypercholesterolemia, unspecified: Secondary | ICD-10-CM

## 2022-12-26 ENCOUNTER — Ambulatory Visit: Payer: Medicare Other

## 2022-12-26 VITALS — Ht <= 58 in | Wt 176.0 lb

## 2022-12-26 DIAGNOSIS — Z0000 Encounter for general adult medical examination without abnormal findings: Secondary | ICD-10-CM

## 2022-12-26 NOTE — Progress Notes (Signed)
Subjective:   Sara Mcgee is a 42 y.o. female who presents for Medicare Annual (Subsequent) preventive examination.  Visit Complete: Virtual I connected with  Tejah B Tworek on 12/26/22 by a audio enabled telemedicine application and verified that I am speaking with the correct person using two identifiers.  Patient Location: Home  Provider Location: Home Office  I discussed the limitations of evaluation and management by telemedicine. The patient expressed understanding and agreed to proceed.  Vital Signs: Because this visit was a virtual/telehealth visit, some criteria may be missing or patient reported. Any vitals not documented were not able to be obtained and vitals that have been documented are patient reported.  Cardiac Risk Factors include: dyslipidemia;sedentary lifestyle;obesity (BMI >30kg/m2)     Objective:    Today's Vitals   12/26/22 1122  Weight: 176 lb (79.8 kg)  Height: 4\' 9"  (1.448 m)   Body mass index is 38.09 kg/m.     12/26/2022   11:31 AM 12/22/2021    2:39 PM 08/01/2019    8:27 AM 04/05/2019    9:35 AM 04/27/2017    6:37 AM 04/21/2017    2:57 PM 04/21/2017    2:47 PM  Advanced Directives  Does Patient Have a Medical Advance Directive? Yes Yes Yes Yes Yes Yes Yes  Type of Estate agent of [place];Living will Living will;Healthcare Power of [employer] Power of [employer] Power of [employer] Power of Attorney  Healthcare Power of Attorney  Does patient want to make changes to medical advance directive? No - Patient declined No - Patient declined  No - Patient declined   No - Patient declined  Copy of Healthcare Power of Attorney in Chart? Yes - validated most recent copy scanned in chart (See row information) No - copy requested No - copy requested No - copy requested No - copy requested  No - copy requested  Would patient like information on creating a medical advance directive?       No - Patient declined     Current Medications (verified) Outpatient Encounter Medications as of 12/26/2022  Medication Sig   colestipol (COLESTID) 1 g tablet TAKE 2 TABLETS (2 G TOTAL) BY MOUTH DAILY.   levothyroxine (SYNTHROID) 100 MCG tablet Take 1 tablet (100 mcg total) by mouth daily.   omeprazole (PRILOSEC) 40 MG capsule Take 1 capsule (40 mg total) by mouth daily.   salicylic acid-lactic acid 17 % external solution Apply topically daily.   sodium fluoride (FLUORISHIELD) 1.1 % GEL dental gel PLEASE SEE ATTACHED FOR DETAILED DIRECTIONS   No facility-administered encounter medications on file as of 12/26/2022.    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)    GERD (gastroesophageal reflux disease)    Hard of hearing    Hepatic steatosis    Hypothyroidism    PONV (postoperative nausea and vomiting)    after last endo at [facility]   Past Surgical History:  Procedure Laterality Date   CHOLECYSTECTOMY N/A 04/23/2015   Procedure: LAPAROSCOPIC CHOLECYSTECTOMY WITH INTRAOPERATIVE CHOLANGIOGRAM;  Surgeon: Almond Lint, MD;  Location: [facility]  OR;  Service: General;  Laterality: N/A;   ENDOSCOPIC RETROGRADE CHOLANGIOPANCREATOGRAPHY (ERCP) WITH PROPOFOL N/A 02/26/2015   Procedure: ENDOSCOPIC RETROGRADE CHOLANGIOPANCREATOGRAPHY (ERCP) WITH PROPOFOL;  Surgeon: Rachael Fee, MD;  Location: [facility] ENDOSCOPY;  Service: Endoscopy;  Laterality: N/A;   ESOPHAGOGASTRODUODENOSCOPY N/A 04/23/2015   Procedure: ESOPHAGOGASTRODUODENOSCOPY (EGD);  Surgeon: Almond Lint, MD;  Location: [facility] OR;  Service: General;  Laterality: N/A;   ESOPHAGOGASTRODUODENOSCOPY (EGD) WITH PROPOFOL N/A 08/01/2019   Procedure:  ESOPHAGOGASTRODUODENOSCOPY (EGD) WITH PROPOFOL;  Surgeon: Rachael Fee, MD;  Location: [facility] ENDOSCOPY;  Service: Endoscopy;  Laterality: N/A;   EUS N/A 02/26/2015   Procedure: UPPER ENDOSCOPIC ULTRASOUND (EUS) LINEAR;  Surgeon: Rachael Fee, MD;  Location: [facility];  Service: Endoscopy;  Laterality: N/A;   EUS N/A 05/05/2016   Procedure: UPPER ENDOSCOPIC ULTRASOUND (EUS) LINEAR;  Surgeon: Rachael Fee, MD;  Location: [facility];  Service: Endoscopy;  Laterality: N/A;  General?   EUS N/A 04/27/2017   Procedure: UPPER ENDOSCOPIC ULTRASOUND (EUS) RADIAL;  Surgeon: Rachael Fee, MD;  Location: [facility];  Service: Endoscopy;  Laterality: N/A;   EUS N/A 08/01/2019   Procedure: UPPER ENDOSCOPIC ULTRASOUND (EUS) RADIAL;  Surgeon: Rachael Fee, MD;  Location: [facility];  Service: Endoscopy;  Laterality: N/A;   FINE NEEDLE ASPIRATION N/A 08/01/2019   Procedure: FINE NEEDLE ASPIRATION (FNA) LINEAR;  Surgeon: Rachael Fee, MD;  Location: [facility];  Service: Endoscopy;  Laterality: N/A;   LAPAROSCOPIC GASTRIC RESECTION N/A 04/23/2015   Procedure: LAPAROSCOPIC PARTIAL GASTRECTOMY;  Surgeon: Almond Lint, MD;  Location: [facility] OR;  Service: General;  Laterality: N/A;   NASAL ENDOSCOPY     TONSILLECTOMY     WISDOM TOOTH EXTRACTION     Family History  Problem Relation Age of Onset   Hypertension Mother    Diabetes Mother    Kidney disease Mother        Stage 2   Dementia Mother    Hypertension Father    Heart disease Father    Cancer Father        lymphoma   Diabetes Maternal Grandmother    Colon cancer Neg Hx    Colon polyps Neg Hx    Esophageal cancer Neg Hx    Social History   Socioeconomic History   Marital status: Single    Spouse name: Not on file   Number of children: 0   Years of education: 12   Highest education level: 12th grade  Occupational History   Occupation: disability  Tobacco Use   Smoking status: Never   Smokeless tobacco: Never  Vaping Use    Vaping status: Never Used  Substance and Sexual Activity   Alcohol use: No    Alcohol/week: 0.0 standard drinks of alcohol   Drug use: No   Sexual activity: Never  Other Topics Concern   Not on file  Social History Narrative   Down Syndrome   She lives with her sister, Campbell Lerner and brother in law   Her parents live 2 houses down   Social Drivers of Health   Financial Resource Strain: Low Risk  (12/26/2022)   Overall Financial Resource Strain (CARDIA)    Difficulty of Paying Living Expenses: Not hard at all  Food Insecurity: No Food Insecurity (12/26/2022)   Hunger Vital Sign    Worried About Running Out of Food in the Last Year: Never true    Ran Out of Food in the Last Year: Never true  Transportation Needs: No Transportation Needs (12/26/2022)  PRAPARE - Administrator, Civil Service (Medical): No    Lack of Transportation (Non-Medical): No  Physical Activity: Insufficiently Active (12/26/2022)   Exercise Vital Sign    Days of Exercise per Week: 3 days    Minutes of Exercise per Session: 30 min  Stress: No Stress Concern Present (12/26/2022)   [employer] of Occupational Health - Occupational Stress Questionnaire    Feeling of Stress : Not at all  Social Connections: Moderately Isolated (12/26/2022)   Social Connection and Isolation Panel [NHANES]    Frequency of Communication with Friends and Family: More than three times a week    Frequency of Social Gatherings with Friends and Family: Three times a week    Attends Religious Services: 1 to 4 times per year    Active Member of Clubs or Organizations: No    Attends Engineer, structural: Never    Marital Status: Never married    Tobacco Counseling Counseling given: Not Answered   Clinical Intake:  Pre-visit preparation completed: Yes  Pain : No/denies pain     Diabetes: No  How often do you need to have someone help you when you read instructions, pamphlets, or other written  materials from your doctor or pharmacy?: 1 - Never  Interpreter Needed?: No  Comments: Assisted with visit by sister in law Jenine Information entered by :: Kandis Fantasia LPN   Activities of Daily Living    12/26/2022   11:30 AM  In your present state of health, do you have any difficulty performing the following activities:  Hearing? 0  Vision? 0  Difficulty concentrating or making decisions? 1  Walking or climbing stairs? 0  Dressing or bathing? 0  Doing errands, shopping? 1  Preparing Food and eating ? N  Using the Toilet? N  In the past six months, have you accidently leaked urine? N  Do you have problems with loss of bowel control? N  Managing your Medications? Y  Managing your Finances? Y  Housekeeping or managing your Housekeeping? Y    Patient Care Team: Dettinger, Elige Radon, MD as PCP - General (Family Medicine) Pyrtle, Carie Caddy, MD as Consulting Physician (Gastroenterology)  Indicate any recent Medical Services you may have received from other than [facility] providers in the past year (date may be approximate).     Assessment:   This is a routine wellness examination for Sara Mcgee.  Hearing/Vision screen Hearing Screening - Comments:: Some hearing difficulties  Vision Screening - Comments::  up to date with routine eye exams with [facility]     Goals Addressed             This Visit's Progress    COMPLETED: Patient Stated       04/05/2019 AWV Goal: Exercise for General Health  Patient will verbalize understanding of the benefits of increased physical activity: Exercising regularly is important. It will improve your overall fitness, flexibility, and endurance. Regular exercise also will improve your overall health. It can help you control your weight, reduce stress, and improve your bone density. Over the next year, patient will increase physical activity as tolerated with a goal of at least 150 minutes of moderate physical activity per week.  You can tell  that you are exercising at a moderate intensity if your heart starts beating faster and you start breathing faster but can still hold a conversation. Moderate-intensity exercise ideas include: Walking 1 mile (1.6 km) in about 15 minutes Insurance claims handler aerobics  Patient will verbalize understanding of everyday activities that increase physical activity by providing examples like the following: Yard work, such as: Insurance underwriter Gardening Washing windows or floors Patient will be able to explain general safety guidelines for exercising:  Before you start a new exercise program, talk with your health care provider. Do not exercise so much that you hurt yourself, feel dizzy, or get very short of breath. Wear comfortable clothes and wear shoes with good support. Drink plenty of water while you exercise to prevent dehydration or heat stroke. Work out until your breathing and your heartbeat get faster.       Depression Screen    12/26/2022   11:26 AM 03/21/2022    1:01 PM 12/22/2021    2:38 PM 09/21/2020    3:31 PM 03/18/2020    2:21 PM 09/18/2019   10:22 AM 03/18/2019   10:00 AM  PHQ 2/9 Scores  PHQ - 2 Score 0 0 0 0 0 0 0    Fall Risk    12/26/2022   11:30 AM 03/21/2022    1:01 PM 12/22/2021    2:36 PM 09/21/2020    3:33 PM 03/18/2020    2:21 PM  Fall Risk   Falls in the past year? 0 0 0 0 0  Number falls in past yr: 0  0 0   Injury with Fall? 0  0 0   Risk for fall due to : No Fall Risks  No Fall Risks Impaired vision   Follow up Falls prevention discussed;Education provided;Falls evaluation completed  Falls evaluation completed;Education provided;Falls prevention discussed Falls prevention discussed     MEDICARE RISK AT HOME: Medicare Risk at Home Any stairs in or around the home?: No If so, are there any without handrails?: No Home free of loose throw rugs in walkways,  pet beds, electrical cords, etc?: Yes Adequate lighting in your home to reduce risk of falls?: Yes Life alert?: No Use of a cane, walker or w/c?: No Grab bars in the bathroom?: Yes Shower chair or bench in shower?: No Elevated toilet seat or a handicapped toilet?: Yes  TIMED UP AND GO:  Was the test performed?  No    Cognitive Function: Patient with current diagnosis of Down Syndrome     12/26/2022   11:31 AM 09/21/2020    3:39 PM 04/05/2019    9:44 AM 02/15/2017    4:28 PM  MMSE - Mini Mental State Exam  Not completed: Unable to complete Unable to complete Unable to complete Unable to complete        12/22/2021    2:40 PM  6CIT Screen  What Year? 0 points  What month? 0 points  What time? 0 points  Count back from 20 0 points  Months in reverse 2 points  Repeat phrase 4 points  Total Score 6 points    Immunizations Immunization History  Administered Date(s) Administered   Influenza,inj,Quad PF,6+ Mos 11/05/2012, 10/31/2014, 10/08/2015, 11/15/2016, 10/26/2017, 11/01/2018, 11/13/2019   Influenza-Unspecified 10/10/2013   PFIZER(Purple Top)SARS-COV-2 Vaccination 04/03/2019, 04/24/2019, 11/25/2019    TDAP status: Due, Education has been provided regarding the importance of this vaccine. Advised may receive this vaccine at local pharmacy or Health Dept. Aware to provide a copy of the vaccination record if obtained from local pharmacy or Health Dept. Verbalized acceptance and understanding.  Flu Vaccine status: Due, Education has been provided regarding the importance of this vaccine.  Advised may receive this vaccine at local pharmacy or Health Dept. Aware to provide a copy of the vaccination record if obtained from local pharmacy or Health Dept. Verbalized acceptance and understanding.  Pneumococcal vaccine status: Up to date  Covid-19 vaccine status: Information provided on how to obtain vaccines.   Qualifies for Shingles Vaccine? No    Screening Tests Health  Maintenance  Topic Date Due   DTaP/Tdap/Td (1 - Tdap) Never done   Cervical Cancer Screening (HPV/Pap Cotest)  Never done   INFLUENZA VACCINE  08/11/2022   COVID-19 Vaccine (4 - 2024-25 season) 09/11/2022   Hepatitis C Screening  03/21/2023 (Originally 02/14/1999)   HIV Screening  03/21/2023 (Originally 02/14/1996)   Medicare Annual Wellness (AWV)  12/26/2023   HPV VACCINES  Aged Out    Health Maintenance  Health Maintenance Due  Topic Date Due   DTaP/Tdap/Td (1 - Tdap) Never done   Cervical Cancer Screening (HPV/Pap Cotest)  Never done   INFLUENZA VACCINE  08/11/2022   COVID-19 Vaccine (4 - 2024-25 season) 09/11/2022    Lung Cancer Screening: (Low Dose CT Chest recommended if Age 75-80 years, 20 pack-year currently smoking OR have quit w/in 15years.) does not qualify.   Lung Cancer Screening Referral: n/a  Additional Screening:  Hepatitis C Screening: does qualify  Vision Screening: Recommended annual ophthalmology exams for early detection of glaucoma and other disorders of the eye. Is the patient up to date with their annual eye exam?  Yes  Who is the provider or what is the name of the office in which the patient attends annual eye exams? [facility]  If pt is not established with a provider, would they like to be referred to a provider to establish care? No .   Dental Screening: Recommended annual dental exams for proper oral hygiene  Community Resource Referral / Chronic Care Management: CRR required this visit?  No   CCM required this visit?  No     Plan:     I have personally reviewed and noted the following in the patient's chart:   Medical and social history Use of alcohol, tobacco or illicit drugs  Current medications and supplements including opioid prescriptions. Patient is not currently taking opioid prescriptions. Functional ability and status Nutritional status Physical activity Advanced directives List of other physicians Hospitalizations,  surgeries, and ER visits in previous 12 months Vitals Screenings to include cognitive, depression, and falls Referrals and appointments  In addition, I have reviewed and discussed with patient certain preventive protocols, quality metrics, and best practice recommendations. A written personalized care plan for preventive services as well as general preventive health recommendations were provided to patient.     Kandis Fantasia [place], [place]   [id number]   After Visit Summary: (MyChart) Due to this being a telephonic visit, the after visit summary with patients personalized plan was offered to patient via MyChart   Nurse Notes: No concerns at this time

## 2022-12-26 NOTE — Patient Instructions (Signed)
Sara Mcgee , Thank you for taking time to come for your Medicare Wellness Visit. I appreciate your ongoing commitment to your health goals. Please review the following plan we discussed and let me know if I can assist you in the future.   Referrals/Orders/Follow-Ups/Clinician Recommendations: Aim for 30 minutes of exercise or brisk walking, 6-8 glasses of water, and 5 servings of fruits and vegetables each day  This is a list of the screening recommended for you and due dates:  Health Maintenance  Topic Date Due   DTaP/Tdap/Td vaccine (1 - Tdap) Never done   Pap with HPV screening  Never done   Flu Shot  08/11/2022   COVID-19 Vaccine (4 - 2024-25 season) 09/11/2022   Hepatitis C Screening  03/21/2023*   HIV Screening  03/21/2023*   Medicare Annual Wellness Visit  12/26/2023   HPV Vaccine  Aged Out  *Topic was postponed. The date shown is not the original due date.    Advanced directives: (ACP Link)Information on Advanced Care Planning can be found at [facility] Advance Health Care Directives Advance Health Care Directives ([email or web address]   Next Medicare Annual Wellness Visit scheduled for next year: Yes

## 2023-03-15 ENCOUNTER — Other Ambulatory Visit: Payer: Self-pay | Admitting: Family Medicine

## 2023-03-15 DIAGNOSIS — E039 Hypothyroidism, unspecified: Secondary | ICD-10-CM

## 2023-03-15 DIAGNOSIS — K219 Gastro-esophageal reflux disease without esophagitis: Secondary | ICD-10-CM

## 2023-03-22 ENCOUNTER — Ambulatory Visit (INDEPENDENT_AMBULATORY_CARE_PROVIDER_SITE_OTHER): Payer: Medicare (Managed Care) | Admitting: Family Medicine

## 2023-03-22 ENCOUNTER — Encounter: Payer: Self-pay | Admitting: Family Medicine

## 2023-03-22 VITALS — BP 118/70 | HR 78 | Ht <= 58 in | Wt 175.0 lb

## 2023-03-22 DIAGNOSIS — E7800 Pure hypercholesterolemia, unspecified: Secondary | ICD-10-CM

## 2023-03-22 DIAGNOSIS — Q909 Down syndrome, unspecified: Secondary | ICD-10-CM

## 2023-03-22 DIAGNOSIS — Z0001 Encounter for general adult medical examination with abnormal findings: Secondary | ICD-10-CM | POA: Diagnosis not present

## 2023-03-22 DIAGNOSIS — K219 Gastro-esophageal reflux disease without esophagitis: Secondary | ICD-10-CM

## 2023-03-22 DIAGNOSIS — E039 Hypothyroidism, unspecified: Secondary | ICD-10-CM

## 2023-03-22 DIAGNOSIS — Z0000 Encounter for general adult medical examination without abnormal findings: Secondary | ICD-10-CM

## 2023-03-22 DIAGNOSIS — Z23 Encounter for immunization: Secondary | ICD-10-CM | POA: Diagnosis not present

## 2023-03-22 LAB — LIPID PANEL

## 2023-03-22 MED ORDER — OMEPRAZOLE 40 MG PO CPDR
40.0000 mg | DELAYED_RELEASE_CAPSULE | Freq: Every day | ORAL | 3 refills | Status: AC
Start: 2023-03-22 — End: ?

## 2023-03-22 MED ORDER — LEVOTHYROXINE SODIUM 100 MCG PO TABS: 100.0000 ug | ORAL_TABLET | Freq: Every day | ORAL | 3 refills | Status: AC

## 2023-03-22 MED ORDER — COLESTIPOL HCL 1 G PO TABS: 2.0000 g | ORAL_TABLET | Freq: Every day | ORAL | 3 refills | Status: AC

## 2023-03-22 NOTE — Progress Notes (Signed)
 BP 118/70   Pulse 78   Ht 4\' 9"  (1.448 m)   Wt 175 lb (79.4 kg)   SpO2 98%   BMI 37.87 kg/m    Subjective:   Patient ID: Sara Mcgee, female    DOB: [birth year], 43 y.o.   MRN: [id number]  HPI: Sara Mcgee is a 43 y.o. female presenting on 03/22/2023 for Medical Management of Chronic Issues (CPE- No pap)   HPI Physical exam Patient denies any chest pain, shortness of breath, headaches or vision issues, abdominal complaints, diarrhea, nausea, vomiting, or joint issues.  Patient has Down syndrome and stable.  She does have some mild memory issues with the Down syndrome but nothing significant yet  Hypothyroidism recheck Patient is coming in for thyroid recheck today as well. They deny any issues with hair changes or heat or cold problems or diarrhea or constipation. They deny any chest pain or palpitations. They are currently on levothyroxine 100 micrograms   Hyperlipidemia Patient is coming in for recheck of his hyperlipidemia. The patient is currently taking colestipol. They deny any issues with myalgias or history of liver damage from it. They deny any focal numbness or weakness or chest pain.   GERD Patient is currently on omeprazole.  She denies any major symptoms or abdominal pain or belching or burping. She denies any blood in her stool or lightheadedness or dizziness.   Relevant past medical, surgical, family and social history reviewed and updated as indicated. Interim medical history since our last visit reviewed. Allergies and medications reviewed and updated.  Review of Systems  Constitutional:  Negative for chills and fever.  Eyes:  Negative for visual disturbance.  Respiratory:  Negative for chest tightness and shortness of breath.   Cardiovascular:  Negative for chest pain and leg swelling.  Genitourinary:  Negative for difficulty urinating and dysuria.  Musculoskeletal:  Negative for back pain and gait problem.  Skin:  Negative for rash.  Neurological:   Negative for dizziness, light-headedness and headaches.  Psychiatric/Behavioral:  Negative for agitation and behavioral problems.   All other systems reviewed and are negative.   Per HPI unless specifically indicated above   Allergies as of 03/22/2023       Reactions   Adhesive [tape] Dermatitis, Other (See Comments)   Blistering rash from drape adhesive in OR>          Medication List        Accurate as of March 22, 2023  1:09 PM. If you have any questions, ask your nurse or doctor.          STOP taking these medications    salicylic acid-lactic acid 17 % external solution Stopped by: Elige Radon Janitza Revuelta       TAKE these medications    colestipol 1 g tablet Commonly known as: COLESTID Take 2 tablets (2 g total) by mouth daily.   levothyroxine 100 MCG tablet Commonly known as: SYNTHROID Take 1 tablet (100 mcg total) by mouth daily.   omeprazole 40 MG capsule Commonly known as: PRILOSEC Take 1 capsule (40 mg total) by mouth daily.   sodium fluoride 1.1 % Gel dental gel Commonly known as: FLUORISHIELD PLEASE SEE ATTACHED FOR DETAILED DIRECTIONS         Objective:   BP 118/70   Pulse 78   Ht 4\' 9"  (1.448 m)   Wt 175 lb (79.4 kg)   SpO2 98%   BMI 37.87 kg/m   Wt Readings from Last 3 Encounters:  03/22/23 175 lb (79.4 kg)  12/26/22 176 lb (79.8 kg)  03/21/22 176 lb (79.8 kg)    Physical Exam Vitals and nursing note reviewed.  Constitutional:      General: She is not in acute distress.    Appearance: Normal appearance. She is well-developed. She is obese. She is not diaphoretic.  HENT:     Right Ear: Tympanic membrane and ear canal normal.     Left Ear: Tympanic membrane and ear canal normal.     Mouth/Throat:     Mouth: Mucous membranes are moist.     Pharynx: Oropharynx is clear. No oropharyngeal exudate or posterior oropharyngeal erythema.  Eyes:     Conjunctiva/sclera: Conjunctivae normal.     Pupils: Pupils are equal, round, and  reactive to light.  Cardiovascular:     Rate and Rhythm: Normal rate and regular rhythm.     Heart sounds: Normal heart sounds. No murmur heard. Pulmonary:     Effort: Pulmonary effort is normal. No respiratory distress.     Breath sounds: Normal breath sounds. No wheezing.  Abdominal:     General: Abdomen is flat. Bowel sounds are normal. There is no distension.     Palpations: Abdomen is soft.     Tenderness: There is no abdominal tenderness. There is no guarding or rebound.     Hernia: No hernia is present.  Musculoskeletal:        General: No swelling or tenderness. Normal range of motion.  Skin:    General: Skin is warm and dry.     Findings: No rash.  Neurological:     Mental Status: She is alert and oriented to person, place, and time.     Coordination: Coordination normal.  Psychiatric:        Behavior: Behavior normal.       Assessment & Plan:   Problem List Items Addressed This Visit       Digestive   GERD (gastroesophageal reflux disease)   Relevant Medications   omeprazole (PRILOSEC) 40 MG capsule   Other Relevant Orders   CBC with Differential/Platelet   CMP14+EGFR     Endocrine   Hypothyroidism   Relevant Medications   levothyroxine (SYNTHROID) 100 MCG tablet   Other Relevant Orders   TSH     Other   HYPERCHOLESTEROLEMIA   Relevant Medications   colestipol (COLESTID) 1 g tablet   Other Relevant Orders   CMP14+EGFR   Lipid panel   DOWN SYNDROME   Relevant Orders   US Pelvis Complete   Other Visit Diagnoses       Physical exam    -  Primary   Relevant Orders   US Pelvis Complete   CBC with Differential/Platelet   CMP14+EGFR   Lipid panel   TSH       Will check blood work today, will also order for ultrasound of the pelvis for screening because she has not wanted to do Pap smears because of the Down syndrome. Follow up plan: Return in about 6 months (around 09/22/2023), or if symptoms worsen or fail to improve, for Hypothyroidism and  hyperlipidemia..  Counseling provided for all of the vaccine components Orders Placed This Encounter  Procedures   US Pelvis Complete   CBC with Differential/Platelet   CMP14+EGFR   Lipid panel   TSH    Arville Care, MD [facility] 03/22/2023, 1:09 PM

## 2023-03-22 NOTE — Addendum Note (Signed)
 Addended by: Dorene Sorrow on: 03/22/2023 01:38 PM   Modules accepted: Orders

## 2023-03-23 ENCOUNTER — Encounter: Payer: Self-pay | Admitting: Family Medicine

## 2023-03-23 LAB — CBC WITH DIFFERENTIAL/PLATELET
Basophils Absolute: 0.1 10*3/uL (ref 0.0–0.2)
Basos: 1 %
EOS (ABSOLUTE): 0.1 10*3/uL (ref 0.0–0.4)
Eos: 1 %
Hematocrit: 37 % (ref 34.0–46.6)
Hemoglobin: 11.3 g/dL (ref 11.1–15.9)
Immature Grans (Abs): 0 10*3/uL (ref 0.0–0.1)
Immature Granulocytes: 0 %
Lymphocytes Absolute: 1.6 10*3/uL (ref 0.7–3.1)
Lymphs: 29 %
MCH: 25.9 pg — ABNORMAL LOW (ref 26.6–33.0)
MCHC: 30.5 g/dL — ABNORMAL LOW (ref 31.5–35.7)
MCV: 85 fL (ref 79–97)
Monocytes Absolute: 0.3 10*3/uL (ref 0.1–0.9)
Monocytes: 6 %
Neutrophils Absolute: 3.4 10*3/uL (ref 1.4–7.0)
Neutrophils: 63 %
Platelets: 307 10*3/uL (ref 150–450)
RBC: 4.37 x10E6/uL (ref 3.77–5.28)
RDW: 18.4 % — ABNORMAL HIGH (ref 11.7–15.4)
WBC: 5.5 10*3/uL (ref 3.4–10.8)

## 2023-03-23 LAB — CMP14+EGFR
ALT: 13 IU/L (ref 0–32)
AST: 14 IU/L (ref 0–40)
Albumin: 4 g/dL (ref 3.9–4.9)
Alkaline Phosphatase: 97 IU/L (ref 44–121)
BUN/Creatinine Ratio: 15 (ref 9–23)
BUN: 18 mg/dL (ref 6–24)
Bilirubin Total: 0.2 mg/dL (ref 0.0–1.2)
CO2: 23 mmol/L (ref 20–29)
Calcium: 8.6 mg/dL — ABNORMAL LOW (ref 8.7–10.2)
Chloride: 105 mmol/L (ref 96–106)
Creatinine, Ser: 1.18 mg/dL — ABNORMAL HIGH (ref 0.57–1.00)
Globulin, Total: 3.2 g/dL (ref 1.5–4.5)
Glucose: 98 mg/dL (ref 70–99)
Potassium: 4.7 mmol/L (ref 3.5–5.2)
Sodium: 141 mmol/L (ref 134–144)
Total Protein: 7.2 g/dL (ref 6.0–8.5)
eGFR: 59 mL/min/{1.73_m2} — ABNORMAL LOW (ref >59)

## 2023-03-23 LAB — LIPID PANEL
Cholesterol, Total: 183 mg/dL (ref 100–199)
HDL: 49 mg/dL (ref >39)
LDL CALC COMMENT:: 3.7 ratio (ref 0.0–4.4)
LDL Chol Calc (NIH): 114 mg/dL — ABNORMAL HIGH (ref 0–99)
Triglycerides: 112 mg/dL (ref 0–149)
VLDL Cholesterol Cal: 20 mg/dL (ref 5–40)

## 2023-03-23 LAB — TSH: TSH: 2.19 u[IU]/mL (ref 0.450–4.500)

## 2023-04-03 ENCOUNTER — Ambulatory Visit (HOSPITAL_COMMUNITY)
Admission: RE | Admit: 2023-04-03 | Discharge: 2023-04-03 | Disposition: A | Discharge status: Home / Self Care | Payer: Medicare (Managed Care) | Source: Ambulatory Visit | Attending: Family Medicine | Admitting: Family Medicine

## 2023-04-03 DIAGNOSIS — Q909 Down syndrome, unspecified: Secondary | ICD-10-CM | POA: Insufficient documentation

## 2023-04-03 DIAGNOSIS — Z0000 Encounter for general adult medical examination without abnormal findings: Secondary | ICD-10-CM | POA: Diagnosis present

## 2023-04-13 ENCOUNTER — Encounter: Payer: Self-pay | Admitting: Family Medicine

## 2023-12-27 ENCOUNTER — Encounter: Payer: Self-pay | Admitting: Family Medicine

## 2023-12-27 ENCOUNTER — Ambulatory Visit: Payer: Medicare (Managed Care)

## 2023-12-27 VITALS — BP 118/70 | HR 78 | Ht <= 58 in | Wt 175.0 lb

## 2023-12-27 DIAGNOSIS — Z0000 Encounter for general adult medical examination without abnormal findings: Secondary | ICD-10-CM

## 2023-12-27 NOTE — Progress Notes (Addendum)
 Chief Complaint  Patient presents with   Medicare Wellness     Subjective:   Sara Mcgee is a 43 y.o. female who presents for a Medicare Annual Wellness Visit.  Visit info / Clinical Intake: Medicare Wellness Visit Type:: Subsequent Annual Wellness Visit Persons participating in visit and providing information:: caregiver (with patient present) Research Officer, [employer] Goetzke(sister in social worker)) Medicare Wellness Visit Mode:: Telephone If telephone:: video declined Since this visit was completed virtually, some vitals may be partially provided or unavailable. Missing vitals are due to the limitations of the virtual format.: Unable to obtain vitals - no equipment (vitals from prev pcp of 03/22/23) If Telephone or Video please confirm:: I connected with patient using audio/video enable telemedicine. I verified patient identity with two identifiers, discussed telehealth limitations, and patient agreed to proceed. Patient Location:: home Provider Location:: office Interpreter Needed?: No Pre-visit prep was completed: yes AWV questionnaire completed by patient prior to visit?: no Living arrangements:: with family/others Patient's Overall Health Status Rating: very good Typical amount of pain: none Does pain affect daily life?: no Are you currently prescribed opioids?: no  Dietary Habits and Nutritional Risks How many meals a day?: 3 Eats fruit and vegetables daily?: yes Most meals are obtained by: preparing own meals In the last 2 weeks, have you had any of the following?: none Diabetic:: no  Functional Status Activities of Daily Living (to include ambulation/medication): (!) Needs Assist Feeding: Independent Dressing/Grooming: Independent Bathing: Needs assistance Restaurant Manager, [employer] in social worker)) Toileting: Needs assistance Restaurant Manager, [employer] in social worker)) Transfer: Independent Ambulation: Independent Medication Administration: Needs assistance (comment) Restaurant Manager, [employer] in social worker)) Home  Management (perform basic housework or laundry): Needs assistance (comment) Research Officer, [employer] Dacy(sister in social worker)) Manage your own finances?: (!) no (Jeniene Beckstead(sister in social worker)) Primary transportation is: family / friends Research Officer, [employer] Brayboy(sister in social worker)) Concerns about vision?: no *vision screening is required for [facility]* (last ov 2023, pt decline wearing glasses/will make an apt soon for updated exam per pt's sister in law, Jenine) Concerns about hearing?: (!) yes Uses hearing aids?: no  Fall Screening Falls in the past year?: 0 Number of falls in past year: 0 Was there an injury with Fall?: 0 Fall Risk Category Calculator: 0 Patient Fall Risk Level: Low Fall Risk  Fall Risk Patient at Risk for Falls Due to: No Fall Risks Fall risk Follow up: Falls evaluation completed; Education provided  Home and Transportation Safety: All rugs have non-skid backing?: yes All stairs or steps have railings?: (!) no (ramp) Grab bars in the bathtub or shower?: yes Have non-skid surface in bathtub or shower?: yes Good home lighting?: yes Regular seat belt use?: yes Hospital stays in the last year:: no  Cognitive Assessment Difficulty concentrating, remembering, or making decisions? : yes Will 6CIT or Mini Cog be Completed: no 6CIT or Mini Cog Declined: patient has a diagnosis of dementia or cognitive impairment  Advance Directives (For Healthcare) Does Patient Have a Medical Advance Directive?: Yes Type of Advance Directive: Healthcare Power of Attorney Copy of Healthcare Power of Attorney in Chart?: Yes - validated most recent copy scanned in chart (See row information)  Reviewed/Updated  Reviewed/Updated: Reviewed All (Medical, Surgical, Family, Medications, Allergies, Care Teams, Patient Goals); Medical History; Surgical History; Family History; Medications; Allergies; Care Teams; Patient Goals    Allergies (verified) Adhesive [tape]   Current Medications (verified) Outpatient Encounter  Medications as of 12/27/2023  Medication Sig   colestipol  (COLESTID ) 1 g tablet Take 2 tablets (2 g total) by mouth daily.   levothyroxine  (  SYNTHROID ) 100 MCG tablet Take 1 tablet (100 mcg total) by mouth daily.   omeprazole  (PRILOSEC) 40 MG capsule Take 1 capsule (40 mg total) by mouth daily.   sodium fluoride (FLUORISHIELD) 1.1 % GEL dental gel PLEASE SEE ATTACHED FOR DETAILED DIRECTIONS   No facility-administered encounter medications on file as of 12/27/2023.    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)    GERD (gastroesophageal reflux disease)    Hard of hearing    Hepatic steatosis    Hypothyroidism    PONV (postoperative nausea and vomiting)    after last endo at [facility]   Past Surgical History:  Procedure Laterality Date   CHOLECYSTECTOMY N/A 04/23/2015   Procedure: LAPAROSCOPIC CHOLECYSTECTOMY WITH INTRAOPERATIVE CHOLANGIOGRAM;  Surgeon: Jina Nephew, MD;  Location: [facility] OR;  Service: General;  Laterality: N/A;   ENDOSCOPIC RETROGRADE CHOLANGIOPANCREATOGRAPHY (ERCP) WITH PROPOFOL  N/A 02/26/2015   Procedure: ENDOSCOPIC RETROGRADE CHOLANGIOPANCREATOGRAPHY (ERCP) WITH PROPOFOL ;  Surgeon: Toribio SHAUNNA Cedar, MD;  Location: [facility];  Service: Endoscopy;  Laterality: N/A;   ESOPHAGOGASTRODUODENOSCOPY N/A 04/23/2015   Procedure: ESOPHAGOGASTRODUODENOSCOPY (EGD);  Surgeon: Jina Nephew, MD;  Location: [facility];  Service: General;  Laterality: N/A;   ESOPHAGOGASTRODUODENOSCOPY (EGD) WITH PROPOFOL  N/A 08/01/2019   Procedure: ESOPHAGOGASTRODUODENOSCOPY (EGD) WITH PROPOFOL ;  Surgeon: Cedar Toribio SHAUNNA, MD;  Location: [facility] END[facility];  Service: Endoscopy;  Laterality: N/A;   EUS N/A 02/26/2015   Procedure: UPPER ENDOSCOPIC  ULTRASOUND (EUS) LINEAR;  Surgeon: Toribio SHAUNNA Cedar, MD;  Location: [facility];  Service: Endoscopy;  Laterality: N/A;   EUS N/A 05/05/2016   Procedure: UPPER ENDOSCOPIC ULTRASOUND (EUS) LINEAR;  Surgeon: Toribio SHAUNNA Cedar, MD;  Location: [facility];  Service: Endoscopy;  Laterality: N/A;  General?   EUS N/A 04/27/2017   Procedure: UPPER ENDOSCOPIC ULTRASOUND (EUS) RADIAL;  Surgeon: Cedar Toribio SHAUNNA, MD;  Location: [facility];  Service: Endoscopy;  Laterality: N/A;   EUS N/A 08/01/2019   Procedure: UPPER ENDOSCOPIC ULTRASOUND (EUS) RADIAL;  Surgeon: Cedar Toribio SHAUNNA, MD;  Location: [facility];  Service: Endoscopy;  Laterality: N/A;   FINE NEEDLE ASPIRATION N/A 08/01/2019   Procedure: FINE NEEDLE ASPIRATION (FNA) LINEAR;  Surgeon: Cedar Toribio SHAUNNA, MD;  Location: [facility];  Service: Endoscopy;  Laterality: N/A;   LAPAROSCOPIC GASTRIC RESECTION N/A 04/23/2015   Procedure: LAPAROSCOPIC PARTIAL GASTRECTOMY;  Surgeon: Jina Nephew, MD;  Location: [facility] OR;  Service: General;  Laterality: N/A;   NASAL ENDOSCOPY     TONSILLECTOMY     WISDOM TOOTH EXTRACTION     Family History  Problem Relation Age of Onset   Hypertension Mother    Diabetes Mother    Kidney disease Mother        Stage 2   Dementia Mother    Hypertension Father    Heart disease Father    Cancer Father        lymphoma   Diabetes Maternal Grandmother    Colon cancer Neg Hx    Colon polyps Neg Hx    Esophageal cancer Neg Hx    Social History   Occupational History   Occupation: disability  Tobacco Use   Smoking  status: Never   Smokeless tobacco: Never  Vaping Use   Vaping status: Never Used  Substance and Sexual Activity   Alcohol use: No    Alcohol/week: 0.0 standard drinks of alcohol   Drug use: No   Sexual activity: Never   Tobacco Counseling Counseling given: Yes  SDOH Screenings   Food Insecurity: No Food Insecurity (12/27/2023)  Housing: Unknown (12/27/2023)  Transportation Needs: No Transportation  Needs (12/27/2023)  Utilities: Not At Risk (12/27/2023)  Alcohol Screen: Low Risk (12/26/2022)  Depression (PHQ2-9): Low Risk (12/27/2023)  Financial Resource Strain: Low Risk (12/26/2022)  Physical Activity: Insufficiently Active (12/27/2023)  Social Connections: Moderately Isolated (12/27/2023)  Stress: No Stress Concern Present (12/27/2023)  Tobacco Use: Low Risk (12/27/2023)  Health Literacy: Inadequate Health Literacy (12/27/2023)   See flowsheets for full screening details  Depression Screen PHQ 2 & 9 Depression Scale- Over the past 2 weeks, how often have you been bothered by any of the following problems? Little interest or pleasure in doing things: 0 Feeling down, depressed, or hopeless (PHQ Adolescent also includes...irritable): 0 PHQ-2 Total Score: 0     Goals Addressed             This Visit's Progress    Exercise 2-3x per week (30 min per time)   On track    Increase walking to 30 minutes 2-3 times per week.             Objective:    Today's Vitals   12/27/23 1012  BP: 118/70  Pulse: 78  Weight: 175 lb (79.4 kg)  Height: 4' 9 (1.448 m)   Body mass index is 37.87 kg/m.  Hearing/Vision screen No results found. Immunizations and Health Maintenance Health Maintenance  Topic Date Due   Hepatitis B Vaccines 19-59 Average Risk (1 of 3 - 19+ 3-dose series) Never done   HPV VACCINES (1 - 3-dose SCDM series) Never done   Mammogram  Never done   COVID-19 Vaccine (4 - 2025-26 season) 09/11/2023   Cervical Cancer Screening (HPV/Pap Cotest)  03/21/2024 (Originally 02/14/2011)   Hepatitis C Screening  03/21/2024 (Originally 02/14/1999)   HIV Screening  03/21/2024 (Originally 02/14/1996)   Medicare Annual Wellness (AWV)  12/26/2024   DTaP/Tdap/Td (2 - Td or Tdap) 03/21/2033   Influenza Vaccine  Completed   Pneumococcal Vaccine  Aged Out   Meningococcal B Vaccine  Aged Out        Assessment/Plan:  This is a routine wellness examination for Jenisis.  Patient  Care Team: Dettinger, Fonda LABOR, MD as PCP - General (Family Medicine) Pyrtle, Gordy HERO, MD as Consulting Physician (Gastroenterology)  I have personally reviewed and noted the following in the patients chart:   Medical and social history Use of alcohol, tobacco or illicit drugs  Current medications and supplements including opioid prescriptions. Functional ability and status Nutritional status Physical activity Advanced directives List of other physicians Hospitalizations, surgeries, and ER visits in previous 12 months Vitals Screenings to include cognitive, depression, and falls Referrals and appointments  No orders of the defined types were placed in this encounter.  In addition, I have reviewed and discussed with patient certain preventive protocols, quality metrics, and best practice recommendations. A written personalized care plan for preventive services as well as general preventive health recommendations were provided to patient.   Ozie Ned, CMA   12/27/2023   Return in 1 year (on 12/26/2024).  After Visit Summary: (MyChart) Due to this being a telephonic visit, the after visit summary  with patients personalized plan was offered to patient via MyChart   Nurse Notes: HM Addressed: Vaccines Due: Covid--decline per pt's sister in law, DTAP, HPV and Hep B; Hep C and HIC screening at next ov w/pcp, per sister in law will discuss other options for mammogram to be done at next ov w/pcp as well. Msg pcp regarding HM   Mammogram scheduled  Cervical Cancer Screening done in 4/25

## 2023-12-27 NOTE — Patient Instructions (Signed)
 Ms. Crutchley,  Thank you for taking the time for your Medicare Wellness Visit. I appreciate your continued commitment to your health goals. Please review the care plan we discussed, and feel free to reach out if I can assist you further.  Please note that Annual Wellness Visits do not include a physical exam. Some assessments may be limited, especially if the visit was conducted virtually. If needed, we may recommend an in-person follow-up with your provider.  Ongoing Care Seeing your primary care provider every 3 to 6 months helps us  monitor your health and provide consistent, personalized care.   Referrals If a referral was made during today's visit and you haven't received any updates within two weeks, please contact the referred provider directly to check on the status.  Recommended Screenings:  Health Maintenance  Topic Date Due   Hepatitis B Vaccine (1 of 3 - 19+ 3-dose series) Never done   HPV Vaccine (1 - 3-dose SCDM series) Never done   Breast Cancer Screening  Never done   COVID-19 Vaccine (4 - 2025-26 season) 09/11/2023   Medicare Annual Wellness Visit  12/26/2023   Pap with HPV screening  03/21/2024*   Hepatitis C Screening  03/21/2024*   HIV Screening  03/21/2024*   DTaP/Tdap/Td vaccine (2 - Td or Tdap) 03/21/2033   Flu Shot  Completed   Pneumococcal Vaccine  Aged Out   Meningitis B Vaccine  Aged Out  *Topic was postponed. The date shown is not the original due date.       12/27/2023    9:35 AM  Advanced Directives  Does Patient Have a Medical Advance Directive? Yes  Type of Advance Directive Healthcare Power of Attorney  Copy of Healthcare Power of Attorney in Chart? Yes - validated most recent copy scanned in chart (See row information)    Vision: Annual vision screenings are recommended for early detection of glaucoma, cataracts, and diabetic retinopathy. These exams can also reveal signs of chronic conditions such as diabetes and high blood pressure.  Dental:  Annual dental screenings help detect early signs of oral cancer, gum disease, and other conditions linked to overall health, including heart disease and diabetes.  Please see the attached documents for additional preventive care recommendations.

## 2024-01-10 NOTE — Progress Notes (Signed)
 I have reviewed and agree with the above AWV documentation Fonda Levins, MD [facility] 01/10/2024, 10:44 AM

## 2024-02-21 ENCOUNTER — Ambulatory Visit: Payer: Medicare (Managed Care) | Admitting: Nurse Practitioner

## 2024-03-22 ENCOUNTER — Encounter: Payer: Medicare (Managed Care) | Admitting: Family Medicine

## 2024-12-27 ENCOUNTER — Ambulatory Visit: Payer: Medicare (Managed Care)
